# Patient Record
Sex: Male | Born: 1960 | Race: White | Hispanic: No | Smoking: Former smoker
Health system: Southern US, Community
[De-identification: ages and names within clinical notes are randomized; demographics above are authoritative.]

## PROBLEM LIST (undated history)

## (undated) DIAGNOSIS — M549 Dorsalgia, unspecified: Secondary | ICD-10-CM

## (undated) DIAGNOSIS — F32A Depression, unspecified: Secondary | ICD-10-CM

## (undated) DIAGNOSIS — F419 Anxiety disorder, unspecified: Secondary | ICD-10-CM

## (undated) DIAGNOSIS — R519 Headache, unspecified: Secondary | ICD-10-CM

## (undated) DIAGNOSIS — G2581 Restless legs syndrome: Secondary | ICD-10-CM

## (undated) DIAGNOSIS — M1990 Unspecified osteoarthritis, unspecified site: Secondary | ICD-10-CM

## (undated) DIAGNOSIS — G629 Polyneuropathy, unspecified: Secondary | ICD-10-CM

## (undated) DIAGNOSIS — G8929 Other chronic pain: Secondary | ICD-10-CM

## (undated) DIAGNOSIS — R51 Headache: Secondary | ICD-10-CM

## (undated) DIAGNOSIS — F329 Major depressive disorder, single episode, unspecified: Secondary | ICD-10-CM

## (undated) DIAGNOSIS — R0600 Dyspnea, unspecified: Secondary | ICD-10-CM

## (undated) DIAGNOSIS — H9190 Unspecified hearing loss, unspecified ear: Secondary | ICD-10-CM

## (undated) HISTORY — PX: TONSILLECTOMY: SUR1361

## (undated) HISTORY — PX: INNER EAR SURGERY: SHX679

## (undated) HISTORY — PX: MOUTH SURGERY: SHX715

## (undated) HISTORY — PX: HERNIA REPAIR: SHX51

---

## 2016-11-27 DIAGNOSIS — R292 Abnormal reflex: Secondary | ICD-10-CM | POA: Insufficient documentation

## 2016-11-27 DIAGNOSIS — I639 Cerebral infarction, unspecified: Secondary | ICD-10-CM | POA: Insufficient documentation

## 2016-12-25 DIAGNOSIS — G8929 Other chronic pain: Secondary | ICD-10-CM | POA: Insufficient documentation

## 2016-12-25 DIAGNOSIS — M545 Low back pain, unspecified: Secondary | ICD-10-CM | POA: Insufficient documentation

## 2017-02-05 ENCOUNTER — Other Ambulatory Visit: Payer: Self-pay | Admitting: Neurological Surgery

## 2017-02-26 ENCOUNTER — Encounter (HOSPITAL_COMMUNITY): Payer: Self-pay

## 2017-02-26 ENCOUNTER — Encounter (HOSPITAL_COMMUNITY)
Admission: RE | Admit: 2017-02-26 | Discharge: 2017-02-26 | Disposition: A | Discharge status: Home / Self Care | Payer: Medicaid Other | Source: Ambulatory Visit | Attending: Neurological Surgery | Admitting: Neurological Surgery

## 2017-02-26 DIAGNOSIS — Z01812 Encounter for preprocedural laboratory examination: Secondary | ICD-10-CM | POA: Diagnosis present

## 2017-02-26 HISTORY — DX: Major depressive disorder, single episode, unspecified: F32.9

## 2017-02-26 HISTORY — DX: Unspecified hearing loss, unspecified ear: H91.90

## 2017-02-26 HISTORY — DX: Restless legs syndrome: G25.81

## 2017-02-26 HISTORY — DX: Headache: R51

## 2017-02-26 HISTORY — DX: Polyneuropathy, unspecified: G62.9

## 2017-02-26 HISTORY — DX: Other chronic pain: G89.29

## 2017-02-26 HISTORY — DX: Headache, unspecified: R51.9

## 2017-02-26 HISTORY — DX: Dorsalgia, unspecified: M54.9

## 2017-02-26 HISTORY — DX: Anxiety disorder, unspecified: F41.9

## 2017-02-26 HISTORY — DX: Unspecified osteoarthritis, unspecified site: M19.90

## 2017-02-26 HISTORY — DX: Depression, unspecified: F32.A

## 2017-02-26 HISTORY — DX: Dyspnea, unspecified: R06.00

## 2017-02-26 LAB — BASIC METABOLIC PANEL
ANION GAP: 6 (ref 5–15)
BUN: 16 mg/dL (ref 6–20)
CALCIUM: 9.1 mg/dL (ref 8.9–10.3)
CO2: 27 mmol/L (ref 22–32)
CREATININE: 0.8 mg/dL (ref 0.61–1.24)
Chloride: 105 mmol/L (ref 101–111)
GFR calc Af Amer: >60 mL/min (ref >60)
GFR calc non Af Amer: >60 mL/min (ref >60)
GLUCOSE: 74 mg/dL (ref 65–99)
Potassium: 4.1 mmol/L (ref 3.5–5.1)
Sodium: 138 mmol/L (ref 135–145)

## 2017-02-26 LAB — TYPE AND SCREEN
ABO/RH(D): O POS
ANTIBODY SCREEN: NEGATIVE

## 2017-02-26 LAB — CBC
HCT: 42.9 % (ref 39.0–52.0)
Hemoglobin: 14.8 g/dL (ref 13.0–17.0)
MCH: 33.3 pg (ref 26.0–34.0)
MCHC: 34.5 g/dL (ref 30.0–36.0)
MCV: 96.6 fL (ref 78.0–100.0)
PLATELETS: 287 10*3/uL (ref 150–400)
RBC: 4.44 MIL/uL (ref 4.22–5.81)
RDW: 13 % (ref 11.5–15.5)
WBC: 7.7 10*3/uL (ref 4.0–10.5)

## 2017-02-26 LAB — SURGICAL PCR SCREEN
MRSA, PCR: NEGATIVE
STAPHYLOCOCCUS AUREUS: NEGATIVE

## 2017-02-26 LAB — ABO/RH: ABO/RH(D): O POS

## 2017-02-26 MED ORDER — CHLORHEXIDINE GLUCONATE CLOTH 2 % EX PADS: 6.0000 | MEDICATED_PAD | Freq: Once | CUTANEOUS | Status: DC

## 2017-02-26 NOTE — Progress Notes (Signed)
Cardiologist denies  Medical Md is Dr.Pamela Penner  Stress test done in 2009  Echo done in 2009  Heart cath denies  EKG denies in past yr   CXR denies in past yr

## 2017-02-26 NOTE — Pre-Procedure Instructions (Signed)
Verne CarrowWilliam Helinski  02/26/2017      [id number] - [place], [place] - [address] [facility] [address] [place] [address] Phone: [phone] Fax: [phone]    Your procedure is scheduled on Fri, May 25 @ 7:30 AM  [facility] at 5:30 AM  Call this number if you have problems the morning of surgery:  [phone]   Remember:  Do not eat food or drink liquids after midnight.               Stop taking your Diclofenac along with any Vitamins or Herbal Medications. No Goody's,BC's,Aleve,Advil,Motrin,Ibuprofen,Fish Oil,or Aspirin.    Do not wear jewelry.  Do not wear lotions, powders,colognes, or deoderant.  Do not shave 48 hours prior to surgery.  Men may shave face and neck.  Do not bring valuables to the hospital.  [facility] belongings or valuables.  Contacts, dentures or bridgework may not be worn into surgery.  Leave your suitcase in the car.  After surgery it may be brought to your room.  For patients admitted to the hospital, discharge time will be determined by your treatment team.  Patients discharged the day of surgery will not be allowed to drive home.   [facility]  Before surgery, you can play an important role.  Because skin is not sterile, your skin needs to be as free of germs as possible.  You can reduce the number of germs on you skin by washing with CHG (chlorahexidine gluconate) soap before surgery.  CHG is an antiseptic cleaner which kills germs and bonds with the skin to continue killing germs even after washing.  Please DO NOT use if you have an allergy to CHG or antibacterial soaps.  If your skin becomes reddened/irritated stop using the CHG and inform your nurse when you arrive at Short Stay.  Do not shave (including legs and underarms) for at least 48 hours prior to the first CHG shower.  You may shave your face.  Please  follow these instructions carefully:   1.  Shower with CHG Soap the night before surgery and the                                morning of Surgery.  2.  If you choose to wash your hair, wash your hair first as usual with your       normal shampoo.  3.  After you shampoo, rinse your hair and body thoroughly to remove the                      Shampoo.  4.  Use CHG as you would any other liquid soap.  You can apply chg directly       to the skin and wash gently with scrungie or a clean washcloth.  5.  Apply the CHG Soap to your body ONLY FROM THE NECK DOWN.        Do not use on open wounds or open sores.  Avoid contact with your eyes,       ears, mouth and genitals (private parts).  Wash genitals (private parts)       with your normal soap.  6.  Wash thoroughly, paying special attention to the area where your surgery  will be performed.  7.  Thoroughly rinse your body with warm water from the neck down.  8.  DO NOT shower/wash with your normal soap after using and rinsing off       the CHG Soap.  9.  Pat yourself dry with a clean towel.            10.  Wear clean pajamas.            11.  Place clean sheets on your bed the night of your first shower and do not        sleep with pets.  Day of Surgery  Do not apply any lotions/deoderants the morning of surgery.  Please wear clean clothes to the hospital/surgery center.     Please read over the following fact sheets that you were given. Pain Booklet, Coughing and Deep Breathing, MRSA Information and Surgical Site Infection Prevention

## 2017-02-27 ENCOUNTER — Other Ambulatory Visit: Payer: Self-pay | Admitting: Neurological Surgery

## 2017-03-06 ENCOUNTER — Inpatient Hospital Stay (HOSPITAL_COMMUNITY): Admission: RE | Admit: 2017-03-06 | Payer: Medicaid Other | Source: Ambulatory Visit | Admitting: Neurological Surgery

## 2017-03-06 ENCOUNTER — Encounter (HOSPITAL_COMMUNITY): Admission: RE | Payer: Self-pay | Source: Ambulatory Visit

## 2017-03-06 SURGERY — ANTERIOR CERVICAL DECOMPRESSION/DISCECTOMY FUSION 3 LEVELS
Anesthesia: General

## 2017-03-11 ENCOUNTER — Encounter (HOSPITAL_COMMUNITY)
Admission: RE | Disposition: A | Discharge status: Home / Self Care | Payer: Self-pay | Source: Ambulatory Visit | Attending: Neurological Surgery

## 2017-03-11 ENCOUNTER — Encounter (HOSPITAL_COMMUNITY): Payer: Self-pay | Admitting: *Deleted

## 2017-03-11 ENCOUNTER — Inpatient Hospital Stay (HOSPITAL_COMMUNITY)
Admission: RE | Admit: 2017-03-11 | Discharge: 2017-03-13 | DRG: 455 | Disposition: A | Discharge status: Home / Self Care | Payer: Medicaid Other | Source: Ambulatory Visit | Attending: Neurological Surgery | Admitting: Neurological Surgery

## 2017-03-11 ENCOUNTER — Inpatient Hospital Stay (HOSPITAL_COMMUNITY): Payer: Medicaid Other | Admitting: Anesthesiology

## 2017-03-11 ENCOUNTER — Inpatient Hospital Stay (HOSPITAL_COMMUNITY): Payer: Medicaid Other

## 2017-03-11 DIAGNOSIS — H9190 Unspecified hearing loss, unspecified ear: Secondary | ICD-10-CM | POA: Diagnosis present

## 2017-03-11 DIAGNOSIS — Z87891 Personal history of nicotine dependence: Secondary | ICD-10-CM | POA: Diagnosis not present

## 2017-03-11 DIAGNOSIS — Z419 Encounter for procedure for purposes other than remedying health state, unspecified: Secondary | ICD-10-CM

## 2017-03-11 DIAGNOSIS — M4722 Other spondylosis with radiculopathy, cervical region: Principal | ICD-10-CM | POA: Diagnosis present

## 2017-03-11 DIAGNOSIS — G2581 Restless legs syndrome: Secondary | ICD-10-CM | POA: Diagnosis present

## 2017-03-11 DIAGNOSIS — M4803 Spinal stenosis, cervicothoracic region: Secondary | ICD-10-CM | POA: Diagnosis present

## 2017-03-11 DIAGNOSIS — M4802 Spinal stenosis, cervical region: Secondary | ICD-10-CM | POA: Diagnosis present

## 2017-03-11 HISTORY — PX: ANTERIOR CERVICAL DECOMP/DISCECTOMY FUSION: SHX1161

## 2017-03-11 SURGERY — ANTERIOR CERVICAL DECOMPRESSION/DISCECTOMY FUSION 3 LEVELS
Anesthesia: General

## 2017-03-11 MED ORDER — ONDANSETRON HCL 4 MG/2ML IJ SOLN
4.0000 mg | Freq: Four times a day (QID) | INTRAMUSCULAR | Status: DC | PRN
  Administered 2017-03-12: 4 mg via INTRAVENOUS

## 2017-03-11 MED ORDER — ZOLPIDEM TARTRATE 5 MG PO TABS: 5.0000 mg | ORAL_TABLET | Freq: Every evening | ORAL | Status: DC | PRN

## 2017-03-11 MED ORDER — ACETAMINOPHEN 500 MG PO TABS
1000.0000 mg | ORAL_TABLET | Freq: Four times a day (QID) | ORAL | Status: DC
  Administered 2017-03-11 – 2017-03-13 (×5): 1000 mg via ORAL
  Filled 2017-03-11 (×5): qty 2

## 2017-03-11 MED ORDER — LIDOCAINE-EPINEPHRINE (PF) 2 %-1:200000 IJ SOLN
INTRAMUSCULAR | Status: AC
  Filled 2017-03-11: qty 20

## 2017-03-11 MED ORDER — THROMBIN 20000 UNITS EX SOLR
CUTANEOUS | Status: DC | PRN
  Administered 2017-03-11: 20000 [IU] via TOPICAL

## 2017-03-11 MED ORDER — DEXAMETHASONE SODIUM PHOSPHATE 4 MG/ML IJ SOLN
2.0000 mg | Freq: Three times a day (TID) | INTRAMUSCULAR | Status: AC
  Administered 2017-03-11 – 2017-03-12 (×3): 2 mg via INTRAVENOUS
  Filled 2017-03-11 (×3): qty 1

## 2017-03-11 MED ORDER — SERTRALINE HCL 50 MG PO TABS
50.0000 mg | ORAL_TABLET | Freq: Every day | ORAL | Status: DC
  Administered 2017-03-11 – 2017-03-12 (×2): 50 mg via ORAL
  Filled 2017-03-11 (×2): qty 1

## 2017-03-11 MED ORDER — ACETAMINOPHEN 10 MG/ML IV SOLN
INTRAVENOUS | Status: DC | PRN
  Administered 2017-03-11: 1000 mg via INTRAVENOUS

## 2017-03-11 MED ORDER — THROMBIN 5000 UNITS EX SOLR
CUTANEOUS | Status: AC
  Filled 2017-03-11: qty 5000

## 2017-03-11 MED ORDER — SODIUM CHLORIDE 0.9% FLUSH
3.0000 mL | Freq: Two times a day (BID) | INTRAVENOUS | Status: DC
Start: 2017-03-11 — End: 2017-03-13
  Administered 2017-03-12: 3 mL via INTRAVENOUS

## 2017-03-11 MED ORDER — SODIUM CHLORIDE 0.9% FLUSH: 3.0000 mL | INTRAVENOUS | Status: DC | PRN

## 2017-03-11 MED ORDER — LIDOCAINE-EPINEPHRINE 2 %-1:100000 IJ SOLN
INTRAMUSCULAR | Status: DC | PRN
  Administered 2017-03-11: 11 mL

## 2017-03-11 MED ORDER — PANTOPRAZOLE SODIUM 40 MG IV SOLR
40.0000 mg | Freq: Every day | INTRAVENOUS | Status: DC
  Administered 2017-03-11 – 2017-03-12 (×2): 40 mg via INTRAVENOUS
  Filled 2017-03-11 (×2): qty 40

## 2017-03-11 MED ORDER — SUGAMMADEX SODIUM 200 MG/2ML IV SOLN
INTRAVENOUS | Status: DC | PRN
  Administered 2017-03-11: 165 mg via INTRAVENOUS

## 2017-03-11 MED ORDER — FENTANYL CITRATE (PF) 100 MCG/2ML IJ SOLN
INTRAMUSCULAR | Status: DC | PRN
  Administered 2017-03-11: 50 ug via INTRAVENOUS
  Administered 2017-03-11: 100 ug via INTRAVENOUS
  Administered 2017-03-11 (×3): 50 ug via INTRAVENOUS
  Administered 2017-03-11 (×2): 25 ug via INTRAVENOUS
  Administered 2017-03-11: 50 ug via INTRAVENOUS

## 2017-03-11 MED ORDER — SUGAMMADEX SODIUM 200 MG/2ML IV SOLN: INTRAVENOUS | Status: DC | PRN

## 2017-03-11 MED ORDER — PROMETHAZINE HCL 25 MG/ML IJ SOLN: 6.2500 mg | INTRAMUSCULAR | Status: DC | PRN

## 2017-03-11 MED ORDER — HYDROMORPHONE HCL 1 MG/ML IJ SOLN
0.2500 mg | INTRAMUSCULAR | Status: DC | PRN
  Administered 2017-03-11: 0.5 mg via INTRAVENOUS

## 2017-03-11 MED ORDER — OXYCODONE HCL 5 MG PO TABS: 5.0000 mg | ORAL_TABLET | Freq: Once | ORAL | Status: DC | PRN

## 2017-03-11 MED ORDER — FENTANYL CITRATE (PF) 250 MCG/5ML IJ SOLN
INTRAMUSCULAR | Status: AC
  Filled 2017-03-11: qty 5

## 2017-03-11 MED ORDER — DEXAMETHASONE SODIUM PHOSPHATE 10 MG/ML IJ SOLN
INTRAMUSCULAR | Status: AC
  Filled 2017-03-11: qty 1

## 2017-03-11 MED ORDER — CEFAZOLIN SODIUM-DEXTROSE 2-4 GM/100ML-% IV SOLN
INTRAVENOUS | Status: AC
  Filled 2017-03-11: qty 100

## 2017-03-11 MED ORDER — HYDROMORPHONE HCL 1 MG/ML IJ SOLN: 0.2500 mg | INTRAMUSCULAR | Status: DC | PRN

## 2017-03-11 MED ORDER — ROPINIROLE HCL 0.25 MG PO TABS: 0.2500 mg | ORAL_TABLET | Freq: Every day | ORAL | Status: DC

## 2017-03-11 MED ORDER — PHENYLEPHRINE HCL 10 MG/ML IJ SOLN
INTRAVENOUS | Status: DC | PRN
  Administered 2017-03-11: 20 ug/min via INTRAVENOUS
  Administered 2017-03-11: 60 ug/min via INTRAVENOUS

## 2017-03-11 MED ORDER — DEXAMETHASONE SODIUM PHOSPHATE 10 MG/ML IJ SOLN
INTRAMUSCULAR | Status: DC | PRN
  Administered 2017-03-11: 10 mg via INTRAVENOUS

## 2017-03-11 MED ORDER — SENNA 8.6 MG PO TABS
1.0000 | ORAL_TABLET | Freq: Two times a day (BID) | ORAL | Status: DC
Start: 2017-03-11 — End: 2017-03-13
  Administered 2017-03-11 – 2017-03-13 (×3): 8.6 mg via ORAL
  Filled 2017-03-11 (×4): qty 1

## 2017-03-11 MED ORDER — ROTIGOTINE 4 MG/24HR TD PT24: 1.0000 | MEDICATED_PATCH | Freq: Every day | TRANSDERMAL | Status: DC

## 2017-03-11 MED ORDER — MIDAZOLAM HCL 5 MG/5ML IJ SOLN
INTRAMUSCULAR | Status: DC | PRN
  Administered 2017-03-11: 2 mg via INTRAVENOUS

## 2017-03-11 MED ORDER — SUGAMMADEX SODIUM 200 MG/2ML IV SOLN
INTRAVENOUS | Status: AC
  Filled 2017-03-11: qty 2

## 2017-03-11 MED ORDER — CHLORHEXIDINE GLUCONATE CLOTH 2 % EX PADS: 6.0000 | MEDICATED_PAD | Freq: Once | CUTANEOUS | Status: DC

## 2017-03-11 MED ORDER — ACETAMINOPHEN 10 MG/ML IV SOLN
INTRAVENOUS | Status: AC
  Filled 2017-03-11: qty 100

## 2017-03-11 MED ORDER — METHOCARBAMOL 750 MG PO TABS
750.0000 mg | ORAL_TABLET | Freq: Four times a day (QID) | ORAL | Status: DC
  Administered 2017-03-11 – 2017-03-13 (×4): 750 mg via ORAL
  Filled 2017-03-11 (×4): qty 1

## 2017-03-11 MED ORDER — PHENYLEPHRINE 40 MCG/ML (10ML) SYRINGE FOR IV PUSH (FOR BLOOD PRESSURE SUPPORT)
PREFILLED_SYRINGE | INTRAVENOUS | Status: AC
  Filled 2017-03-11: qty 10

## 2017-03-11 MED ORDER — PROPOFOL 10 MG/ML IV BOLUS
INTRAVENOUS | Status: AC
  Filled 2017-03-11: qty 20

## 2017-03-11 MED ORDER — OXYCODONE HCL 5 MG/5ML PO SOLN: 5.0000 mg | Freq: Once | ORAL | Status: DC | PRN

## 2017-03-11 MED ORDER — GABAPENTIN 300 MG PO CAPS
300.0000 mg | ORAL_CAPSULE | Freq: Three times a day (TID) | ORAL | Status: DC
  Administered 2017-03-11 – 2017-03-13 (×4): 300 mg via ORAL
  Filled 2017-03-11 (×4): qty 1

## 2017-03-11 MED ORDER — PHENYLEPHRINE 40 MCG/ML (10ML) SYRINGE FOR IV PUSH (FOR BLOOD PRESSURE SUPPORT)
PREFILLED_SYRINGE | INTRAVENOUS | Status: DC | PRN
  Administered 2017-03-11: 80 ug via INTRAVENOUS
  Administered 2017-03-11 (×4): 40 ug via INTRAVENOUS

## 2017-03-11 MED ORDER — BUPIVACAINE-EPINEPHRINE (PF) 0.5% -1:200000 IJ SOLN
INTRAMUSCULAR | Status: AC
  Filled 2017-03-11: qty 30

## 2017-03-11 MED ORDER — PROPOFOL 10 MG/ML IV BOLUS
INTRAVENOUS | Status: DC | PRN
  Administered 2017-03-11: 150 mg via INTRAVENOUS

## 2017-03-11 MED ORDER — ROCURONIUM BROMIDE 10 MG/ML (PF) SYRINGE
PREFILLED_SYRINGE | INTRAVENOUS | Status: DC | PRN
  Administered 2017-03-11: 10 mg via INTRAVENOUS
  Administered 2017-03-11: 50 mg via INTRAVENOUS
  Administered 2017-03-11 (×2): 10 mg via INTRAVENOUS

## 2017-03-11 MED ORDER — BUPIVACAINE-EPINEPHRINE (PF) 0.5% -1:200000 IJ SOLN
INTRAMUSCULAR | Status: DC | PRN
  Administered 2017-03-11: 11 mL

## 2017-03-11 MED ORDER — ONDANSETRON HCL 4 MG/2ML IJ SOLN
INTRAMUSCULAR | Status: AC
  Filled 2017-03-11: qty 2

## 2017-03-11 MED ORDER — THROMBIN 20000 UNITS EX SOLR
CUTANEOUS | Status: AC
  Filled 2017-03-11: qty 20000

## 2017-03-11 MED ORDER — PHENOL 1.4 % MT LIQD: 1.0000 | OROMUCOSAL | Status: DC | PRN

## 2017-03-11 MED ORDER — ONDANSETRON HCL 4 MG PO TABS
4.0000 mg | ORAL_TABLET | Freq: Four times a day (QID) | ORAL | Status: DC | PRN
Start: 2017-03-11 — End: 2017-03-13

## 2017-03-11 MED ORDER — SODIUM CHLORIDE 0.9 % IV SOLN
250.0000 mL | INTRAVENOUS | Status: DC
Start: 2017-03-11 — End: 2017-03-13

## 2017-03-11 MED ORDER — MIDAZOLAM HCL 2 MG/2ML IJ SOLN
INTRAMUSCULAR | Status: AC
  Filled 2017-03-11: qty 2

## 2017-03-11 MED ORDER — CELECOXIB 200 MG PO CAPS
200.0000 mg | ORAL_CAPSULE | Freq: Two times a day (BID) | ORAL | Status: DC
Start: 2017-03-11 — End: 2017-03-13
  Administered 2017-03-11 – 2017-03-13 (×3): 200 mg via ORAL
  Filled 2017-03-11 (×3): qty 1

## 2017-03-11 MED ORDER — ONDANSETRON HCL 4 MG/2ML IJ SOLN
INTRAMUSCULAR | Status: DC | PRN
  Administered 2017-03-11: 4 mg via INTRAVENOUS

## 2017-03-11 MED ORDER — SODIUM CHLORIDE 0.9 % IV SOLN
INTRAVENOUS | Status: DC
  Administered 2017-03-11: 22:00:00 via INTRAVENOUS

## 2017-03-11 MED ORDER — DOCUSATE SODIUM 100 MG PO CAPS
100.0000 mg | ORAL_CAPSULE | Freq: Two times a day (BID) | ORAL | Status: DC
  Administered 2017-03-11 – 2017-03-13 (×3): 100 mg via ORAL
  Filled 2017-03-11 (×3): qty 1

## 2017-03-11 MED ORDER — THROMBIN 5000 UNITS EX SOLR
CUTANEOUS | Status: DC | PRN
  Administered 2017-03-11: 5000 [IU] via TOPICAL

## 2017-03-11 MED ORDER — HYDROMORPHONE HCL 1 MG/ML IJ SOLN
INTRAMUSCULAR | Status: AC
  Filled 2017-03-11: qty 0.5

## 2017-03-11 MED ORDER — BISACODYL 10 MG RE SUPP: 10.0000 mg | Freq: Every day | RECTAL | Status: DC | PRN

## 2017-03-11 MED ORDER — MENTHOL 3 MG MT LOZG: 1.0000 | LOZENGE | OROMUCOSAL | Status: DC | PRN

## 2017-03-11 MED ORDER — SODIUM CHLORIDE 0.9 % IR SOLN
Status: DC | PRN
  Administered 2017-03-11: 12:00:00

## 2017-03-11 MED ORDER — DIAZEPAM 5 MG PO TABS
5.0000 mg | ORAL_TABLET | Freq: Four times a day (QID) | ORAL | Status: DC | PRN
  Filled 2017-03-11: qty 1

## 2017-03-11 MED ORDER — 0.9 % SODIUM CHLORIDE (POUR BTL) OPTIME
TOPICAL | Status: DC | PRN
  Administered 2017-03-11: 1000 mL

## 2017-03-11 MED ORDER — THROMBIN 5000 UNITS EX SOLR
OROMUCOSAL | Status: DC | PRN
  Administered 2017-03-11 (×2): via TOPICAL

## 2017-03-11 MED ORDER — FLEET ENEMA 7-19 GM/118ML RE ENEM: 1.0000 | ENEMA | Freq: Once | RECTAL | Status: DC | PRN

## 2017-03-11 MED ORDER — LACTATED RINGERS IV SOLN
INTRAVENOUS | Status: DC
  Administered 2017-03-11 (×2): via INTRAVENOUS

## 2017-03-11 MED ORDER — CEFAZOLIN SODIUM-DEXTROSE 2-4 GM/100ML-% IV SOLN
2.0000 g | INTRAVENOUS | Status: AC
  Administered 2017-03-11: 2 g via INTRAVENOUS

## 2017-03-11 MED ORDER — CEFAZOLIN SODIUM-DEXTROSE 1-4 GM/50ML-% IV SOLN
1.0000 g | Freq: Three times a day (TID) | INTRAVENOUS | Status: DC
  Administered 2017-03-11 – 2017-03-13 (×5): 1 g via INTRAVENOUS
  Filled 2017-03-11 (×5): qty 50

## 2017-03-11 MED ORDER — OXYCODONE HCL 5 MG PO TABS
5.0000 mg | ORAL_TABLET | ORAL | Status: DC | PRN
  Administered 2017-03-11: 5 mg via ORAL
  Administered 2017-03-12 (×2): 10 mg via ORAL
  Filled 2017-03-11: qty 2
  Filled 2017-03-11: qty 1

## 2017-03-11 MED ORDER — OXYCODONE HCL ER 10 MG PO T12A
10.0000 mg | EXTENDED_RELEASE_TABLET | Freq: Two times a day (BID) | ORAL | Status: DC
  Administered 2017-03-11 – 2017-03-13 (×3): 10 mg via ORAL
  Filled 2017-03-11 (×3): qty 1

## 2017-03-11 MED ORDER — LIDOCAINE HCL (CARDIAC) 20 MG/ML IV SOLN
INTRAVENOUS | Status: DC | PRN
  Administered 2017-03-11: 60 mg via INTRAVENOUS

## 2017-03-11 MED ORDER — GLYCOPYRROLATE 0.2 MG/ML IV SOSY
PREFILLED_SYRINGE | INTRAVENOUS | Status: DC | PRN
  Administered 2017-03-11: .2 mg via INTRAVENOUS

## 2017-03-11 SURGICAL SUPPLY — 69 items
BIT DRILL INVIZIA (BIT) ×1 IMPLANT
BLADE ULTRA TIP 2M (BLADE) IMPLANT
BONE EQUIVA 5CC (Bone Implant) ×2 IMPLANT
BUR MATCHSTICK NEURO 3.0 LAGG (BURR) ×2 IMPLANT
CANISTER SUCT 3000ML PPV (MISCELLANEOUS) ×2 IMPLANT
CARTRIDGE OIL MAESTRO DRILL (MISCELLANEOUS) ×1 IMPLANT
CHLORAPREP W/TINT 26ML (MISCELLANEOUS) ×2 IMPLANT
DECANTER SPIKE VIAL GLASS SM (MISCELLANEOUS) ×2 IMPLANT
DERMABOND ADVANCED (GAUZE/BANDAGES/DRESSINGS) ×1
DERMABOND ADVANCED .7 DNX12 (GAUZE/BANDAGES/DRESSINGS) ×1 IMPLANT
DIFFUSER DRILL AIR PNEUMATIC (MISCELLANEOUS) ×2 IMPLANT
DRAPE C-ARM 42X72 X-RAY (DRAPES) ×4 IMPLANT
DRAPE HALF SHEET 40X57 (DRAPES) IMPLANT
DRAPE LAPAROTOMY 100X72 PEDS (DRAPES) ×2 IMPLANT
DRAPE MICROSCOPE LEICA (MISCELLANEOUS) ×2 IMPLANT
DRAPE POUCH INSTRU U-SHP 10X18 (DRAPES) ×2 IMPLANT
DRAPE SHEET LG 3/4 BI-LAMINATE (DRAPES) ×2 IMPLANT
DRILL BIT INVIZIA (BIT) ×2
DRSG OPSITE POSTOP 3X4 (GAUZE/BANDAGES/DRESSINGS) ×2 IMPLANT
ELECT COATED BLADE 2.86 ST (ELECTRODE) ×2 IMPLANT
ELECT REM PT RETURN 9FT ADLT (ELECTROSURGICAL) ×2
ELECTRODE REM PT RTRN 9FT ADLT (ELECTROSURGICAL) ×1 IMPLANT
EVACUATOR 1/8 PVC DRAIN (DRAIN) ×2 IMPLANT
GAUZE SPONGE 4X4 12PLY STRL (GAUZE/BANDAGES/DRESSINGS) IMPLANT
GAUZE SPONGE 4X4 16PLY XRAY LF (GAUZE/BANDAGES/DRESSINGS) IMPLANT
GLOVE BIO SURGEON STRL SZ7 (GLOVE) IMPLANT
GLOVE BIOGEL PI IND STRL 7.0 (GLOVE) ×2 IMPLANT
GLOVE BIOGEL PI IND STRL 7.5 (GLOVE) ×1 IMPLANT
GLOVE BIOGEL PI INDICATOR 7.0 (GLOVE) ×2
GLOVE BIOGEL PI INDICATOR 7.5 (GLOVE) ×1
GLOVE EXAM NITRILE LRG STRL (GLOVE) IMPLANT
GLOVE SS BIOGEL STRL SZ 7.5 (GLOVE) ×3 IMPLANT
GLOVE SUPERSENSE BIOGEL SZ 7.5 (GLOVE) ×3
GOWN STRL REUS W/ TWL LRG LVL3 (GOWN DISPOSABLE) ×1 IMPLANT
GOWN STRL REUS W/ TWL XL LVL3 (GOWN DISPOSABLE) ×1 IMPLANT
GOWN STRL REUS W/TWL LRG LVL3 (GOWN DISPOSABLE) ×1
GOWN STRL REUS W/TWL XL LVL3 (GOWN DISPOSABLE) ×1
HEMOSTAT POWDER KIT SURGIFOAM (HEMOSTASIS) ×4 IMPLANT
INTERBODY TM 14X14X5-7DEG ANG (Metal Cage) ×6 IMPLANT
KIT BASIN OR (CUSTOM PROCEDURE TRAY) ×2 IMPLANT
KIT ROOM TURNOVER OR (KITS) ×2 IMPLANT
NEEDLE HYPO 21X1.5 SAFETY (NEEDLE) ×2 IMPLANT
NEEDLE SPNL 18GX3.5 QUINCKE PK (NEEDLE) ×2 IMPLANT
NS IRRIG 1000ML POUR BTL (IV SOLUTION) ×2 IMPLANT
OIL CARTRIDGE MAESTRO DRILL (MISCELLANEOUS) ×2
PACK LAMINECTOMY NEURO (CUSTOM PROCEDURE TRAY) ×2 IMPLANT
PAD ARMBOARD 7.5X6 YLW CONV (MISCELLANEOUS) ×2 IMPLANT
PATTIES SURGICAL .5X1.5 (GAUZE/BANDAGES/DRESSINGS) ×2 IMPLANT
PIN DISTRACTION 14MM (PIN) ×4 IMPLANT
PIN THREADED TEMP FIX INVIZIA (PIN) ×2 IMPLANT
PLATE INVIZIA 3 LEV 60MM (Plate) ×2 IMPLANT
RUBBERBAND STERILE (MISCELLANEOUS) IMPLANT
SCREW 16MM (Screw) ×12 IMPLANT
SCREW SPINAL 42.X16MM TITAN (Screw) ×4 IMPLANT
SPONGE INTESTINAL PEANUT (DISPOSABLE) ×2 IMPLANT
SPONGE SURGIFOAM ABS GEL SZ50 (HEMOSTASIS) ×2 IMPLANT
STAPLER VISISTAT 35W (STAPLE) ×2 IMPLANT
STOCKINETTE 6  STRL (DRAPES) ×1
STOCKINETTE 6 STRL (DRAPES) ×1 IMPLANT
SUT MNCRL AB 3-0 PS2 18 (SUTURE) ×2 IMPLANT
SUT STRATAFIX MNCRL+ 3-0 PS-2 (SUTURE) ×2
SUT STRATAFIX MONOCRYL 3-0 (SUTURE) ×2
SUT VIC AB 3-0 SH 8-18 (SUTURE) ×2 IMPLANT
SUTURE STRATFX MNCRL+ 3-0 PS-2 (SUTURE) ×2 IMPLANT
TOWEL GREEN STERILE (TOWEL DISPOSABLE) ×2 IMPLANT
TOWEL GREEN STERILE FF (TOWEL DISPOSABLE) ×4 IMPLANT
TRAY FOLEY CATH SILVER 16FR (SET/KITS/TRAYS/PACK) ×2 IMPLANT
TUBE CONNECTING 12X1/4 (SUCTIONS) ×2 IMPLANT
WATER STERILE IRR 1000ML POUR (IV SOLUTION) ×2 IMPLANT

## 2017-03-11 NOTE — Anesthesia Preprocedure Evaluation (Addendum)
Anesthesia Evaluation  Patient identified by MRN, date of birth, ID band Patient awake    Reviewed: Allergy & Precautions, NPO status , Patient's Chart, lab work & pertinent test results  Airway Mallampati: II  TM Distance: >3 FB Neck ROM: Limited    Dental no notable dental hx. (+) Edentulous Upper, Missing   Pulmonary former smoker,    Pulmonary exam normal breath sounds clear to auscultation       Cardiovascular negative cardio ROS Normal cardiovascular exam Rhythm:Regular Rate:Normal  Over 4 MET's   Neuro/Psych Anxiety Depression deafness negative neurological ROS     GI/Hepatic negative GI ROS, Neg liver ROS,   Endo/Other  negative endocrine ROS  Renal/GU negative Renal ROS  negative genitourinary   Musculoskeletal negative musculoskeletal ROS (+)   Abdominal   Peds negative pediatric ROS (+)  Hematology negative hematology ROS (+)   Anesthesia Other Findings Restless leg syn  Reproductive/Obstetrics negative OB ROS                           Anesthesia Physical Anesthesia Plan  ASA: III  Anesthesia Plan: General   Post-op Pain Management:    Induction: Intravenous  Airway Management Planned: Oral ETT  Additional Equipment:   Intra-op Plan:   Post-operative Plan: Extubation in OR  Informed Consent: I have reviewed the patients History and Physical, chart, labs and discussed the procedure including the risks, benefits and alternatives for the proposed anesthesia with the patient or authorized representative who has indicated his/her understanding and acceptance.   Dental advisory given  Plan Discussed with: CRNA and Surgeon  Anesthesia Plan Comments:        Anesthesia Quick Evaluation

## 2017-03-11 NOTE — Anesthesia Procedure Notes (Signed)
Procedure Name: Intubation Date/Time: 03/11/2017 12:29 PM Performed by: Merrilyn Puma B Pre-anesthesia Checklist: Patient identified, Emergency Drugs available, Suction available, Patient being monitored and Timeout performed Patient Re-evaluated:Patient Re-evaluated prior to inductionOxygen Delivery Method: Circle system utilized Preoxygenation: Pre-oxygenation with 100% oxygen Intubation Type: IV induction Ventilation: Mask ventilation without difficulty Laryngoscope Size: Mac and 3 Grade View: Grade I Tube type: Oral Tube size: 7.5 mm Number of attempts: 1 Airway Equipment and Method: Stylet Placement Confirmation: ETT inserted through vocal cords under direct vision,  positive ETCO2,  CO2 detector and breath sounds checked- equal and bilateral Secured at: 22 cm Tube secured with: Tape Dental Injury: Teeth and Oropharynx as per pre-operative assessment

## 2017-03-11 NOTE — Anesthesia Preprocedure Evaluation (Addendum)
Anesthesia Evaluation  Patient identified by MRN, date of birth, ID band Patient awake    Reviewed: Allergy & Precautions, NPO status , Patient's Chart, lab work & pertinent test results  Airway Mallampati: II  TM Distance: >3 FB Neck ROM: Limited    Dental no notable dental hx. (+) Edentulous Upper, Missing, Dental Advisory Given   Pulmonary former smoker,    Pulmonary exam normal breath sounds clear to auscultation       Cardiovascular negative cardio ROS Normal cardiovascular exam Rhythm:Regular Rate:Normal  Over 4 MET's   Neuro/Psych Anxiety Depression deafness negative neurological ROS     GI/Hepatic negative GI ROS, Neg liver ROS,   Endo/Other  negative endocrine ROS  Renal/GU negative Renal ROS  negative genitourinary   Musculoskeletal negative musculoskeletal ROS (+)   Abdominal   Peds negative pediatric ROS (+)  Hematology negative hematology ROS (+)   Anesthesia Other Findings Restless leg syn  Reproductive/Obstetrics negative OB ROS                            Lab Results  Component Value Date   WBC 7.7 02/26/2017   HGB 14.8 02/26/2017   HCT 42.9 02/26/2017   MCV 96.6 02/26/2017   PLT 287 02/26/2017   Lab Results  Component Value Date   CREATININE 0.80 02/26/2017   BUN 16 02/26/2017   NA 138 02/26/2017   K 4.1 02/26/2017   CL 105 02/26/2017   CO2 27 02/26/2017    Anesthesia Physical  Anesthesia Plan  ASA: III  Anesthesia Plan: General   Post-op Pain Management:    Induction: Intravenous  Airway Management Planned: Oral ETT  Additional Equipment:   Intra-op Plan:   Post-operative Plan: Possible Post-op intubation/ventilation  Informed Consent: I have reviewed the patients History and Physical, chart, labs and discussed the procedure including the risks, benefits and alternatives for the proposed anesthesia with the patient or authorized representative  who has indicated his/her understanding and acceptance.   Dental advisory given  Plan Discussed with: CRNA, Surgeon and Anesthesiologist  Anesthesia Plan Comments:       Anesthesia Quick Evaluation

## 2017-03-11 NOTE — H&P (Addendum)
CC:  No chief complaint on file. Neck and bilateral arm pain  HPI: Albert Hubbard is a 56 y.o. male with multilevel cervical spondylosis with radiculopathy.  He has left arm weakness and bilateral pain, tingling, and numbness.  PMH: Past Medical History:  Diagnosis Date  . Anxiety   . Arthritis   . Chronic back pain   . Depression    takes Sertaline daily  . Dyspnea    rarely but when does it's with exertion  . Hard of hearing   . Headache   . Peripheral neuropathy    takes Gabapentin daily  . Restless leg syndrome    takes Requip daily    PSH: Past Surgical History:  Procedure Laterality Date  . HERNIA REPAIR    . INNER EAR SURGERY    . MOUTH SURGERY    . TONSILLECTOMY      SH: Social History  Substance Use Topics  . Smoking status: Former Games developermoker  . Smokeless tobacco: Never Used     Comment: stopped smoking over a yr ago  . Alcohol use Yes     Comment: 6 pks a week    MEDS: Prior to Admission medications   Medication Sig Start Date End Date Taking? Authorizing Provider  diclofenac (VOLTAREN) 75 MG EC tablet Take 75 mg by mouth every evening.  12/03/16  Yes [provider]  gabapentin (NEURONTIN) 300 MG capsule Take 300 mg by mouth at bedtime.  12/25/16  Yes [provider]  rotigotine (NEUPRO) 4 MG/24HR Place 1 patch onto the skin daily.   Yes [provider]  sertraline (ZOLOFT) 100 MG tablet Take 50 mg by mouth at bedtime.  12/08/16  Yes [provider]  Pseudoephedrine HCl (SUPHEDRIN PO) Take 1 tablet by mouth daily as needed (allergies).    [provider]  rOPINIRole (REQUIP) 0.25 MG tablet Take 0.25 mg by mouth daily as needed.    [provider]    ALLERGY: Allergies  Allergen Reactions  . Cortisone Other (See Comments)    Unknown Patient thinks he has had this since with no issues    ROS: ROS  NEUROLOGIC EXAM: Awake, alert, oriented Memory and concentration grossly intact Speech fluent,  appropriate CN grossly intact Motor exam: Upper Extremities Deltoid Bicep Tricep Grip  Right 4/5 5/5 5/5 5/5  Left 3/5 4/5 3/5 4/5   Lower Extremity IP Quad PF DF EHL  Right 5/5 5/5 5/5 5/5 5/5  Left 5/5 5/5 5/5 5/5 5/5   Sensation grossly intact to LT  IMAGING: No new imaging  IMPRESSION: - 56 y.o. male with cervical spondylosis with radiculopathy and deficits as above  PLAN: - C4-7 ACDF today; C3-T1 decompression with foraminotomies and internal fixation and fusion tomorrow - Risks, benefits, and alternatives discussed; he wishes to proceed.

## 2017-03-11 NOTE — Op Note (Signed)
03/11/2017  3:44 PM  PATIENT:  Albert Hubbard  56 y.o. male  PRE-OPERATIVE DIAGNOSIS:  Cervical spondylosis with radiculopathy  POST-OPERATIVE DIAGNOSIS:  Same  PROCEDURE:  C4-7 anterior discectomy with fusion and plate fixation  SURGEON:  Hulan Saas, MD  ASSISTANTS: Donalee Citrin, MD  ANESTHESIA:   General  DRAINS: Medium hemovac   SPECIMEN:  None  INDICATION FOR PROCEDURE: 56 year old male with neck, shoulder, and arm pain and weakness.  Patient understood the risks, benefits, and alternatives and potential outcomes and wished to proceed.  PROCEDURE DETAILS: Patient was brought to the operating room placed under general endotracheal anesthesia. Patient was placed in the supine position on the operating room table. The neck was prepped with betadine and chloraprep and draped in a sterile fashion.   A transverse incision was made on the right side of the neck. Dissection was carried down thru the subcutaneous tissue and the platysma was elevated, opened vertically, and undermined with Metzenbaum scissors. Dissection was then carried out thru an avascular plane leaving the sternocleidomastoid, carotid artery, and jugular vein laterally and the trachea and esophagus medially. The ventral aspect of the vertebral column was identified and a localizing x-ray was taken. The C4-5 level was identified. The longus colli muscles were then elevated and the retractor was placed. The annulus was incised and the disc space entered. Discectomy was performed with micro-curettes and pituitary and kerrison rongeurs. I then used the high-speed drill to drill the endplates down to the level of the posterior longitudinal ligament. The operating microscope was draped and brought into the field provided additional magnification, illumination and visualization. Utilizing microsurgical technique, discectomy was continued posteriorly thru the disc space. Posterior longitudinal ligament was opened with a nerve  hook, and then removed along with disc herniation and osteophytes, decompressing the spinal canal and thecal sac. We then continued to remove osteophytic overgrowth and disc material decompressing the neural foramina and exiting nerve roots bilaterally. So by both visualization and palpation we felt we had an adequate decompression of the neural elements. We then measured the height of the intravertebral disc space and selected a trabecular metal interbody cage packed with equivabone. It was then gently positioned in the intravertebral disc space and countersunk.   The superior distraction pin was then removed and bone bleeding was stopped with bone wax. The distraction pin was inserted at the next inferior level. The annulus was incised and the disc space entered. Discectomy was performed with micro-curettes and pituitary and kerrison rongeurs. I then used the high-speed drill to drill the endplates down to the level of the posterior longitudinal ligament. The operating microscope was returned to the field.  The discectomy was continued posteriorly thru the disc space. Posterior longitudinal ligament was opened with a nerve hook, and then removed along with disc herniation and osteophytes, decompressing the spinal canal and thecal sac. We then continued to remove osteophytic overgrowth and disc material decompressing the neural foramina and exiting nerve roots bilaterally. So by both visualization and palpation we felt we had an adequate decompression of the neural elements. We then measured the height of the intravertebral disc space and selected a second interbody cage which was packed with equivabone. It was then gently positioned in the intravertebral disc space and countersunk.   The superior distraction pin was then again removed and bone bleeding was stopped with bone wax. The distraction pin was inserted at the most inferior level. The annulus was incised and the disc space entered. Discectomy was  performed with micro-curettes and pituitary and kerrison rongeurs. I then used the high-speed drill to drill the endplates down to the level of the posterior longitudinal ligament. The operating microscope was returned to the field.  The discectomy was continued posteriorly thru the disc space. Posterior longitudinal ligament was opened with a nerve hook, and then removed along with disc herniation and osteophytes, decompressing the spinal canal and thecal sac. We then continued to remove osteophytic overgrowth and disc material decompressing the neural foramina and exiting nerve roots bilaterally. So by both visualization and palpation we felt we had an adequate decompression of the neural elements. We then measured the height of the intravertebral disc space and selected a third interbody cage which was packed with equivabone. It was then gently positioned in the intravertebral disc space and countersunk.   I then inserted a titanium plate and placed six variable angle screws into the three superior vertebral bodies and two fixed angle screws at the inferior level and locked them into position. The wound was irrigated with bacitracin solution, checked for hemostasis which was established and confirmed. Once meticulous hemostasis was achieved a medium hemovac drain was placed. The platysma was closed with interrupted 3-0 undyed Vicryl suture, the subcuticular layer was closed with running monocryl suture. The skin edges were approximated with dermabond. The drapes were removed. A sterile dressing was applied. The patient was then awakened from general anesthesia and transferred to the recovery room in stable condition. At the end of the procedure all sponge, needle and instrument counts were correct.  PATIENT DISPOSITION:  PACU - hemodynamically stable. He will return to the OR tomorrow for C3-T1 posterior decompression, fixation, and fusion.   Delay start of Pharmacological VTE agent (>24hrs) due to surgical  blood loss or risk of bleeding:  yes

## 2017-03-11 NOTE — Transfer of Care (Signed)
Immediate Anesthesia Transfer of Care Note  Patient: Albert Hubbard  Procedure(s) Performed: Procedure(s) with comments: Stage 1: C4-7 Anterior cervical discectomy with fusion and plate fixation (N/A) - Stage 1: C4-7 Anterior cervical discectomy with fusion and plate fixation  Patient Location: PACU  Anesthesia Type:General  Level of Consciousness: awake and alert   Airway & Oxygen Therapy: Patient Spontanous Breathing and Patient connected to nasal cannula oxygen  Post-op Assessment: Report given to RN, Post -op Vital signs reviewed and stable and Patient moving all extremities X 4  Post vital signs: Reviewed and stable  Last Vitals:  Vitals:   03/11/17 1019 03/11/17 1602  BP: 114/79 123/84  Pulse: 71 93  Resp: 18 14  Temp: 36.7 C 36.1 C    Last Pain:  Vitals:   03/11/17 1047  TempSrc:   PainSc: 7       Patients Stated Pain Goal: 7 (03/11/17 1047)  Complications: No apparent anesthesia complications

## 2017-03-12 ENCOUNTER — Encounter (HOSPITAL_COMMUNITY)
Admission: RE | Disposition: A | Discharge status: Home / Self Care | Payer: Self-pay | Source: Ambulatory Visit | Attending: Neurological Surgery

## 2017-03-12 ENCOUNTER — Encounter (HOSPITAL_COMMUNITY): Payer: Self-pay | Admitting: Neurological Surgery

## 2017-03-12 ENCOUNTER — Inpatient Hospital Stay (HOSPITAL_COMMUNITY): Payer: Medicaid Other | Admitting: Anesthesiology

## 2017-03-12 ENCOUNTER — Inpatient Hospital Stay (HOSPITAL_COMMUNITY): Admission: RE | Admit: 2017-03-12 | Payer: Medicaid Other | Source: Ambulatory Visit | Admitting: Neurological Surgery

## 2017-03-12 ENCOUNTER — Inpatient Hospital Stay (HOSPITAL_COMMUNITY): Payer: Medicaid Other

## 2017-03-12 HISTORY — PX: POSTERIOR CERVICAL FUSION/FORAMINOTOMY: SHX5038

## 2017-03-12 LAB — GLUCOSE, CAPILLARY: Glucose-Capillary: 267 mg/dL — ABNORMAL HIGH (ref 65–99)

## 2017-03-12 SURGERY — POSTERIOR CERVICAL FUSION/FORAMINOTOMY LEVEL 5
Anesthesia: General

## 2017-03-12 MED ORDER — MIDAZOLAM HCL 2 MG/2ML IJ SOLN
INTRAMUSCULAR | Status: DC | PRN
  Administered 2017-03-12: 2 mg via INTRAVENOUS

## 2017-03-12 MED ORDER — FENTANYL CITRATE (PF) 250 MCG/5ML IJ SOLN
INTRAMUSCULAR | Status: DC | PRN
  Administered 2017-03-12 (×2): 50 ug via INTRAVENOUS
  Administered 2017-03-12: 150 ug via INTRAVENOUS

## 2017-03-12 MED ORDER — THROMBIN 5000 UNITS EX SOLR
CUTANEOUS | Status: DC | PRN
  Administered 2017-03-12: 10000 [IU] via TOPICAL

## 2017-03-12 MED ORDER — LIDOCAINE-EPINEPHRINE 2 %-1:100000 IJ SOLN
INTRAMUSCULAR | Status: DC | PRN
  Administered 2017-03-12: 10 mL via INTRADERMAL

## 2017-03-12 MED ORDER — LIDOCAINE 2% (20 MG/ML) 5 ML SYRINGE
INTRAMUSCULAR | Status: DC | PRN
  Administered 2017-03-12: 100 mg via INTRAVENOUS

## 2017-03-12 MED ORDER — LACTATED RINGERS IV SOLN
INTRAVENOUS | Status: DC | PRN
  Administered 2017-03-12 (×2): via INTRAVENOUS

## 2017-03-12 MED ORDER — PHENYLEPHRINE HCL 10 MG/ML IJ SOLN
INTRAVENOUS | Status: DC | PRN
  Administered 2017-03-12: 20 ug/min via INTRAVENOUS

## 2017-03-12 MED ORDER — GELATIN ABSORBABLE MT POWD
OROMUCOSAL | Status: DC | PRN
  Administered 2017-03-12: 09:00:00 via TOPICAL

## 2017-03-12 MED ORDER — SODIUM CHLORIDE 0.9 % IJ SOLN
INTRAMUSCULAR | Status: AC
  Filled 2017-03-12: qty 10

## 2017-03-12 MED ORDER — SODIUM CHLORIDE 0.9 % IJ SOLN
INTRAMUSCULAR | Status: DC | PRN
  Administered 2017-03-12: 10 mL

## 2017-03-12 MED ORDER — ROCURONIUM BROMIDE 10 MG/ML (PF) SYRINGE
PREFILLED_SYRINGE | INTRAVENOUS | Status: DC | PRN
  Administered 2017-03-12: 20 mg via INTRAVENOUS
  Administered 2017-03-12: 50 mg via INTRAVENOUS
  Administered 2017-03-12: 30 mg via INTRAVENOUS
  Administered 2017-03-12: 20 mg via INTRAVENOUS

## 2017-03-12 MED ORDER — DEXAMETHASONE SODIUM PHOSPHATE 10 MG/ML IJ SOLN
INTRAMUSCULAR | Status: DC | PRN
  Administered 2017-03-12: 10 mg via INTRAVENOUS

## 2017-03-12 MED ORDER — VANCOMYCIN HCL 1000 MG IV SOLR
INTRAVENOUS | Status: DC | PRN
  Administered 2017-03-12: 1000 mg via TOPICAL

## 2017-03-12 MED ORDER — 0.9 % SODIUM CHLORIDE (POUR BTL) OPTIME
TOPICAL | Status: DC | PRN
  Administered 2017-03-12: 1000 mL

## 2017-03-12 MED ORDER — BACITRACIN ZINC 500 UNIT/GM EX OINT
TOPICAL_OINTMENT | CUTANEOUS | Status: DC | PRN
  Administered 2017-03-12: 1 via TOPICAL

## 2017-03-12 MED ORDER — BUPIVACAINE LIPOSOME 1.3 % IJ SUSP
20.0000 mL | INTRAMUSCULAR | Status: AC
Start: 2017-03-12 — End: 2017-03-12
  Administered 2017-03-12: 20 mL
  Filled 2017-03-12: qty 20

## 2017-03-12 MED ORDER — BUPIVACAINE HCL 0.5 % IJ SOLN
INTRAMUSCULAR | Status: DC | PRN
  Administered 2017-03-12: 10 mL

## 2017-03-12 MED ORDER — SUGAMMADEX SODIUM 200 MG/2ML IV SOLN
INTRAVENOUS | Status: DC | PRN
  Administered 2017-03-12: 160 mg via INTRAVENOUS

## 2017-03-12 MED ORDER — HEMOSTATIC AGENTS (NO CHARGE) OPTIME
TOPICAL | Status: DC | PRN
  Administered 2017-03-12: 1 via TOPICAL

## 2017-03-12 MED ORDER — SODIUM CHLORIDE 0.9 % IR SOLN
Status: DC | PRN
  Administered 2017-03-12: 500 mL

## 2017-03-12 MED ORDER — PROPOFOL 10 MG/ML IV BOLUS
INTRAVENOUS | Status: DC | PRN
  Administered 2017-03-12: 140 mg via INTRAVENOUS
  Administered 2017-03-12: 20 mg via INTRAVENOUS

## 2017-03-12 MED ORDER — ARTIFICIAL TEARS OPHTHALMIC OINT
TOPICAL_OINTMENT | OPHTHALMIC | Status: DC | PRN
  Administered 2017-03-12: 1 via OPHTHALMIC

## 2017-03-12 MED ORDER — HYDROMORPHONE HCL 1 MG/ML IJ SOLN
0.2500 mg | INTRAMUSCULAR | Status: DC | PRN
  Administered 2017-03-12 (×2): 0.5 mg via INTRAVENOUS

## 2017-03-12 MED ORDER — PROMETHAZINE HCL 25 MG/ML IJ SOLN: 6.2500 mg | INTRAMUSCULAR | Status: DC | PRN

## 2017-03-12 SURGICAL SUPPLY — 90 items
BAG DECANTER FOR FLEXI CONT (MISCELLANEOUS) ×2 IMPLANT
BENZOIN TINCTURE PRP APPL 2/3 (GAUZE/BANDAGES/DRESSINGS) ×4 IMPLANT
BLADE CLIPPER SURG (BLADE) ×2 IMPLANT
BLADE ULTRA TIP 2M (BLADE) IMPLANT
BONE EQUIVA 10CC (Bone Implant) ×2 IMPLANT
BUR MATCHSTICK NEURO 3.0 LAGG (BURR) ×2 IMPLANT
BUR PRECISION FLUTE 5.0 (BURR) ×2 IMPLANT
CANISTER SUCT 3000ML PPV (MISCELLANEOUS) ×2 IMPLANT
CAP CLSR POST CERV (Cap) ×24 IMPLANT
CARTRIDGE OIL MAESTRO DRILL (MISCELLANEOUS) ×1 IMPLANT
CHLORAPREP W/TINT 26ML (MISCELLANEOUS) ×2 IMPLANT
CONT SPEC 4OZ CLIKSEAL STRL BL (MISCELLANEOUS) ×4 IMPLANT
COVER BACK TABLE 60X90IN (DRAPES) IMPLANT
DECANTER SPIKE VIAL GLASS SM (MISCELLANEOUS) IMPLANT
DERMABOND ADVANCED (GAUZE/BANDAGES/DRESSINGS) ×1
DERMABOND ADVANCED .7 DNX12 (GAUZE/BANDAGES/DRESSINGS) ×1 IMPLANT
DIFFUSER DRILL AIR PNEUMATIC (MISCELLANEOUS) ×2 IMPLANT
DRAPE C-ARM 42X72 X-RAY (DRAPES) ×2 IMPLANT
DRAPE C-ARMOR (DRAPES) ×2 IMPLANT
DRAPE MICROSCOPE LEICA (MISCELLANEOUS) IMPLANT
DRAPE POUCH INSTRU U-SHP 10X18 (DRAPES) ×2 IMPLANT
DRSG OPSITE POSTOP 4X6 (GAUZE/BANDAGES/DRESSINGS) ×2 IMPLANT
DRSG OPSITE POSTOP 4X8 (GAUZE/BANDAGES/DRESSINGS) ×2 IMPLANT
DRSG PAD ABDOMINAL 8X10 ST (GAUZE/BANDAGES/DRESSINGS) IMPLANT
ELECT REM PT RETURN 9FT ADLT (ELECTROSURGICAL) ×2
ELECTRODE REM PT RTRN 9FT ADLT (ELECTROSURGICAL) ×1 IMPLANT
EVACUATOR 1/8 PVC DRAIN (DRAIN) ×2 IMPLANT
GAUZE SPONGE 4X4 12PLY STRL (GAUZE/BANDAGES/DRESSINGS) ×2 IMPLANT
GAUZE SPONGE 4X4 16PLY XRAY LF (GAUZE/BANDAGES/DRESSINGS) IMPLANT
GLOVE BIO SURGEON STRL SZ7 (GLOVE) IMPLANT
GLOVE BIO SURGEON STRL SZ8 (GLOVE) ×2 IMPLANT
GLOVE BIOGEL PI IND STRL 7.0 (GLOVE) IMPLANT
GLOVE BIOGEL PI IND STRL 7.5 (GLOVE) ×1 IMPLANT
GLOVE BIOGEL PI INDICATOR 7.0 (GLOVE)
GLOVE BIOGEL PI INDICATOR 7.5 (GLOVE) ×1
GLOVE SS BIOGEL STRL SZ 7.5 (GLOVE) ×3 IMPLANT
GLOVE SUPERSENSE BIOGEL SZ 7.5 (GLOVE) ×3
GOWN STRL REUS W/ TWL LRG LVL3 (GOWN DISPOSABLE) ×1 IMPLANT
GOWN STRL REUS W/ TWL XL LVL3 (GOWN DISPOSABLE) ×1 IMPLANT
GOWN STRL REUS W/TWL 2XL LVL3 (GOWN DISPOSABLE) ×4 IMPLANT
GOWN STRL REUS W/TWL LRG LVL3 (GOWN DISPOSABLE) ×1
GOWN STRL REUS W/TWL XL LVL3 (GOWN DISPOSABLE) ×1
HEMOSTAT POWDER KIT SURGIFOAM (HEMOSTASIS) ×2 IMPLANT
HEMOSTAT POWDER SURGIFOAM 1G (HEMOSTASIS) IMPLANT
HEMOSTAT SURGICEL 2X14 (HEMOSTASIS) ×2 IMPLANT
KIT BASIN OR (CUSTOM PROCEDURE TRAY) ×2 IMPLANT
KIT INFUSE SMALL (Orthopedic Implant) ×2 IMPLANT
KIT ROOM TURNOVER OR (KITS) ×2 IMPLANT
MARKER SKIN DUAL TIP RULER LAB (MISCELLANEOUS) ×2 IMPLANT
NEEDLE HYPO 18GX1.5 BLUNT FILL (NEEDLE) ×2 IMPLANT
NEEDLE HYPO 21X1.5 SAFETY (NEEDLE) ×2 IMPLANT
NEEDLE HYPO 25X1 1.5 SAFETY (NEEDLE) ×2 IMPLANT
NEEDLE SPNL 22GX3.5 QUINCKE BK (NEEDLE) ×2 IMPLANT
NS IRRIG 1000ML POUR BTL (IV SOLUTION) ×2 IMPLANT
OIL CARTRIDGE MAESTRO DRILL (MISCELLANEOUS) ×2
PACK LAMINECTOMY NEURO (CUSTOM PROCEDURE TRAY) ×2 IMPLANT
PACK UNIVERSAL I (CUSTOM PROCEDURE TRAY) ×2 IMPLANT
PATTIES SURGICAL .5X1.5 (GAUZE/BANDAGES/DRESSINGS) IMPLANT
PIN MAYFIELD SKULL DISP (PIN) ×2 IMPLANT
ROD CVD VIRAGE 3.5X80 (Rod) ×4 IMPLANT
RUBBERBAND STERILE (MISCELLANEOUS) IMPLANT
SCREW VIRAGE 3.5X14 (Screw) ×20 IMPLANT
SCREW VIRAGE 3.5X24MM (Screw) ×4 IMPLANT
SEALER BIPOLAR AQUA 2.3 (INSTRUMENTS) ×2 IMPLANT
SPONGE INTESTINAL PEANUT (DISPOSABLE) ×2 IMPLANT
SPONGE NEURO XRAY DETECT 1X3 (DISPOSABLE) ×2 IMPLANT
SPONGE SURGIFOAM ABS GEL SZ50 (HEMOSTASIS) ×2 IMPLANT
STAPLER VISISTAT 35W (STAPLE) ×2 IMPLANT
STRIP CLOSURE SKIN 1/2X4 (GAUZE/BANDAGES/DRESSINGS) ×2 IMPLANT
STRIP SURGICAL 1 X 6 IN (GAUZE/BANDAGES/DRESSINGS) IMPLANT
STRIP SURGICAL 1/2 X 6 IN (GAUZE/BANDAGES/DRESSINGS) IMPLANT
STRIP SURGICAL 1/4 X 6 IN (GAUZE/BANDAGES/DRESSINGS) IMPLANT
STRIP SURGICAL 3/4 X 6 IN (GAUZE/BANDAGES/DRESSINGS) IMPLANT
SUT STRATAFIX 1PDS 45CM VIOLET (SUTURE) ×2 IMPLANT
SUT STRATAFIX MNCRL+ 3-0 PS-2 (SUTURE) ×1
SUT STRATAFIX MONOCRYL 3-0 (SUTURE) ×1
SUT STRATAFIX SPIRAL + 2-0 (SUTURE) ×2 IMPLANT
SUT VIC AB 0 CT1 18XCR BRD8 (SUTURE) ×1 IMPLANT
SUT VIC AB 0 CT1 8-18 (SUTURE) ×1
SUT VIC AB 2-0 CT1 18 (SUTURE) ×2 IMPLANT
SUT VIC AB 3-0 SH 8-18 (SUTURE) ×2 IMPLANT
SUTURE STRATFX MNCRL+ 3-0 PS-2 (SUTURE) ×1 IMPLANT
SYR 30ML LL (SYRINGE) ×4 IMPLANT
SYR 3ML LL SCALE MARK (SYRINGE) IMPLANT
SYRINGE 20CC LL (MISCELLANEOUS) ×2 IMPLANT
TOWEL GREEN STERILE (TOWEL DISPOSABLE) ×2 IMPLANT
TOWEL GREEN STERILE FF (TOWEL DISPOSABLE) ×2 IMPLANT
TRAY FOLEY W/METER SILVER 16FR (SET/KITS/TRAYS/PACK) IMPLANT
UNDERPAD 30X30 (UNDERPADS AND DIAPERS) ×2 IMPLANT
WATER STERILE IRR 1000ML POUR (IV SOLUTION) ×2 IMPLANT

## 2017-03-12 NOTE — Anesthesia Procedure Notes (Signed)
Procedure Name: Intubation Date/Time: 03/12/2017 7:47 AM Performed by: Burt EkURNER, Sheilyn Boehlke ASHLEY Pre-anesthesia Checklist: Patient identified, Emergency Drugs available, Patient being monitored and Suction available Patient Re-evaluated:Patient Re-evaluated prior to inductionOxygen Delivery Method: Circle system utilized Preoxygenation: Pre-oxygenation with 100% oxygen Intubation Type: IV induction Ventilation: Mask ventilation without difficulty Laryngoscope Size: Glidescope and 3 Grade View: Grade I Tube type: Oral Tube size: 7.5 mm Number of attempts: 1 Airway Equipment and Method: Stylet and Video-laryngoscopy Placement Confirmation: ETT inserted through vocal cords under direct vision,  positive ETCO2 and breath sounds checked- equal and bilateral Secured at: 23 cm Tube secured with: Tape Dental Injury: Teeth and Oropharynx as per pre-operative assessment

## 2017-03-12 NOTE — Anesthesia Postprocedure Evaluation (Signed)
Anesthesia Post Note  Patient: Verne CarrowWilliam Auvil  Procedure(s) Performed: Procedure(s) (LRB): Stage 2: Cervical three-Thoracic one Laminectiomies with foraminotomies, fixation and fusion (N/A)     Patient location during evaluation: PACU Anesthesia Type: General Level of consciousness: awake and alert Pain management: pain level controlled Vital Signs Assessment: post-procedure vital signs reviewed and stable Respiratory status: spontaneous breathing, nonlabored ventilation, respiratory function stable and patient connected to nasal cannula oxygen Cardiovascular status: blood pressure returned to baseline and stable Postop Assessment: no signs of nausea or vomiting Anesthetic complications: no    Last Vitals:  Vitals:   03/12/17 1230 03/12/17 1300  BP: 114/79 127/79  Pulse: 81 86  Resp: 14 16  Temp:  36.9 C    Last Pain:  Vitals:   03/12/17 1300  TempSrc: Oral  PainSc:                  Kennieth RadFitzgerald, Aviendha Azbell E

## 2017-03-12 NOTE — Anesthesia Postprocedure Evaluation (Signed)
Anesthesia Post Note  Patient: Albert Hubbard  Procedure(s) Performed: Procedure(s) (LRB): Stage 1: C4-7 Anterior cervical discectomy with fusion and plate fixation (N/A)  Patient location during evaluation: PACU Anesthesia Type: General Level of consciousness: awake and alert Pain management: pain level controlled Vital Signs Assessment: post-procedure vital signs reviewed and stable Respiratory status: spontaneous breathing, nonlabored ventilation, respiratory function stable and patient connected to nasal cannula oxygen Cardiovascular status: blood pressure returned to baseline and stable Postop Assessment: no signs of nausea or vomiting Anesthetic complications: no       Last Vitals:  Vitals:   03/12/17 0416 03/12/17 0606  BP: (!) 93/58 112/71  Pulse: 100 88  Resp: 18 20  Temp: 37 C 36.8 C    Last Pain:  Vitals:   03/12/17 0606  TempSrc: Oral  PainSc:                  Catheryn Baconyan P Sherry Blackard

## 2017-03-12 NOTE — Transfer of Care (Signed)
Immediate Anesthesia Transfer of Care Note  Patient: Verne CarrowWilliam Droke  Procedure(s) Performed: Procedure(s) with comments: Stage 2: Cervical three-Thoracic one Laminectiomies with foraminotomies, fixation and fusion (N/A) - Stage 2: C3-T1 Laminectiomies with foraminotomies, fixation and fusion  Patient Location: PACU  Anesthesia Type:General  Level of Consciousness: awake, alert , oriented and patient cooperative  Airway & Oxygen Therapy: Patient Spontanous Breathing and Patient connected to nasal cannula oxygen  Post-op Assessment: Report given to RN, Post -op Vital signs reviewed and stable and Patient moving all extremities X 4  Post vital signs: Reviewed and stable  Last Vitals:  Vitals:   03/12/17 0416 03/12/17 0606  BP: (!) 93/58 112/71  Pulse: 100 88  Resp: 18 20  Temp: 37 C 36.8 C    Last Pain:  Vitals:   03/12/17 0606  TempSrc: Oral  PainSc:       Patients Stated Pain Goal: 2 (03/11/17 2114)  Complications: No apparent anesthesia complications

## 2017-03-12 NOTE — Brief Op Note (Signed)
03/11/2017 - 03/12/2017  11:00 AM  PATIENT:  Albert Hubbard  56 y.o. male  PRE-OPERATIVE DIAGNOSIS:  Other spondylosis with radiculopathy, Cervical region  POST-OPERATIVE DIAGNOSIS:  Other spondylosis with radiculopathy, Cervical region  PROCEDURE:  Procedure(s) with comments: Stage 2: Cervical three-Thoracic one Laminectiomies with foraminotomies, fixation and fusion (N/A) - Stage 2: C3-T1 Laminectiomies with foraminotomies, fixation and fusion  SURGEON:  Surgeon(s) and Role:    * Ditty, Loura HaltBenjamin Jared, MD - Primary    * Tia AlertJones, David S, MD - Assisting  PHYSICIAN ASSISTANT:   ASSISTANTS: Marikay Alaravid Jones, MD  ANESTHESIA:   general  EBL:  Total I/O In: 1000 [I.V.:1000] Out: 470 [Urine:320; Blood:150]  BLOOD ADMINISTERED:none  DRAINS: Medium hemovac   LOCAL MEDICATIONS USED:  MARCAINE    and LIDOCAINE   SPECIMEN:  No Specimen  DISPOSITION OF SPECIMEN:  N/A  COUNTS:  YES  TOURNIQUET:  * No tourniquets in log *  DICTATION: .Dragon Dictation  PLAN OF CARE: Admit to inpatient   PATIENT DISPOSITION:  PACU - hemodynamically stable.   Delay start of Pharmacological VTE agent (>24hrs) due to surgical blood loss or risk of bleeding: yes

## 2017-03-12 NOTE — Progress Notes (Signed)
No acute overnight events Upper and lower extremity strength unchanged from yesterday Incision c/d/i Ready for stage 2 today

## 2017-03-12 NOTE — Evaluation (Signed)
Physical Therapy Evaluation Patient Details Name: Albert Hubbard MRN: [id number] DOB: 1960-12-01 Today's Date: 03/12/2017   History of Present Illness  Pt is a 56 yo male admitted for elective C4-C7 anterior discectomy with fusion and place insertion on 03/11/17 and C3-T1 laminectomy, foraminotomy, fixation and fusion on 03/12/17. PMH significant for anxiety, OA, chornic back pain, HOH.   Clinical Impression  Pt presents with the above diagnosis and below deficits for therapy evaluation. Prior to admission, pt was completely independent and able to do for himself living alone. Pt now requires Min guard for mobility in order to safely perform gait and bed mobility. Pt will be going home with his friend and family who will be available to assist with recovery. Pt will require additional acute PT services in order to address stair negotiation and ensure safe gait prior to discharge.     Follow Up Recommendations No PT follow up    Equipment Recommendations  None recommended by PT    Recommendations for Other Services       Precautions / Restrictions Precautions Precautions: Cervical Required Braces or Orthoses: Cervical Brace Cervical Brace: Hard collar;Other (comment);For comfort (per order may remove in bed, going to bathroom, bathing) Restrictions Weight Bearing Restrictions: No      Mobility  Bed Mobility Overal bed mobility: Needs Assistance Bed Mobility: Supine to Sit;Sit to Supine     Supine to sit: Min guard Sit to supine: Min guard   General bed mobility comments: Min guard to assist trunk to EOB in sitting, Min guard to positiong LE's in bed  Transfers Overall transfer level: Needs assistance Equipment used: None Transfers: Sit to/from Stand Sit to Stand: Min guard         General transfer comment: Min guard for safety from EOB  Ambulation/Gait Ambulation/Gait assistance: Min guard;Min assist Ambulation Distance (Feet): 150 Feet Assistive device: None (IV  pole) Gait Pattern/deviations: Step-through pattern;Decreased step length - right;Decreased step length - left Gait velocity: decrease Gait velocity interpretation: Below normal speed for age/gender General Gait Details: Mild unsteady gait with 1 hand on IV pole. Pt with c/o being unable to widen his BOS to ambulate. Gait becomes more unsteady as gait distance increases  Stairs            Wheelchair Mobility    Modified Rankin (Stroke Patients Only)       Balance Overall balance assessment: Needs assistance Sitting-balance support: No upper extremity supported;Feet supported Sitting balance-Leahy Scale: Good     Standing balance support: No upper extremity supported Standing balance-Leahy Scale: Fair                               Pertinent Vitals/Pain Pain Assessment: 0-10 Pain Score: 7  Pain Location: cervical region Pain Descriptors / Indicators: Aching;Grimacing;Guarding Pain Intervention(s): Monitored during session;Premedicated before session;Repositioned    Home Living Family/patient expects to be discharged to:: Private residence Living Arrangements: Non-relatives/Friends;Other (Comment) (going to stay with a friend and his family) Available Help at Discharge: Friend(s);Available 24 hours/day Type of Home: House Home Access: Stairs to enter Entrance Stairs-Rails: None Entrance Stairs-Number of Steps: 2-3 Home Layout: One level Home Equipment: None      Prior Function Level of Independence: Independent         Comments: completely independent and working      Hand Dominance   Dominant Hand: Right    Extremity/Trunk Assessment   Upper Extremity Assessment Upper Extremity Assessment: Defer  to OT evaluation    Lower Extremity Assessment Lower Extremity Assessment: Generalized weakness    Cervical / Trunk Assessment Cervical / Trunk Assessment: Other exceptions Cervical / Trunk Exceptions: cervical surgery   Communication    Communication: HOH;No difficulties  Cognition Arousal/Alertness: Awake/alert Behavior During Therapy: WFL for tasks assessed/performed Overall Cognitive Status: Within Functional Limits for tasks assessed                                        General Comments      Exercises     Assessment/Plan    PT Assessment Patient needs continued PT services  PT Problem List Decreased activity tolerance;Decreased balance;Decreased mobility;Pain       PT Treatment Interventions DME instruction;Gait training;Stair training;Functional mobility training;Therapeutic activities;Therapeutic exercise;Balance training;Patient/family education    PT Goals (Current goals can be found in the Care Plan section)  Acute Rehab PT Goals Patient Stated Goal: to have less pain and walk better PT Goal Formulation: With patient Time For Goal Achievement: 03/19/17 Potential to Achieve Goals: Good    Frequency Min 5X/week   Barriers to discharge        Co-evaluation               AM-PAC PT "6 Clicks" Daily Activity  Outcome Measure Difficulty turning over in bed (including adjusting bedclothes, sheets and blankets)?: None Difficulty moving from lying on back to sitting on the side of the bed? : A Little Difficulty sitting down on and standing up from a chair with arms (e.g., wheelchair, bedside commode, etc,.)?: A Little Help needed moving to and from a bed to chair (including a wheelchair)?: A Little Help needed walking in hospital room?: A Little Help needed climbing 3-5 steps with a railing? : A Little 6 Click Score: 19    End of Session Equipment Utilized During Treatment: Gait belt;Cervical collar Activity Tolerance: Patient limited by lethargy Patient left: in bed;with family/visitor present;with call bell/phone within reach Nurse Communication: Mobility status PT Visit Diagnosis: Unsteadiness on feet (R26.81);Difficulty in walking, not elsewhere classified (R26.2)     Time: 1510-1530 PT Time Calculation (min) (ACUTE ONLY): 20 min   Charges:   PT Evaluation $PT Eval Moderate Complexity: 1 Procedure     PT G Codes:        Colin BroachSabra M. Torrin Crihfield PT, DPT  [phone]   Roxy MannsSabra Marie Salene Mohamud 03/12/2017, 4:27 PM

## 2017-03-13 ENCOUNTER — Encounter (HOSPITAL_COMMUNITY): Payer: Self-pay | Admitting: Neurological Surgery

## 2017-03-13 MED ORDER — METHOCARBAMOL 750 MG PO TABS: 750.0000 mg | ORAL_TABLET | Freq: Four times a day (QID) | ORAL | 2 refills | Status: DC | PRN

## 2017-03-13 MED ORDER — GABAPENTIN 300 MG PO CAPS: 300.0000 mg | ORAL_CAPSULE | Freq: Three times a day (TID) | ORAL | 2 refills | Status: DC

## 2017-03-13 MED ORDER — HYDROCODONE-ACETAMINOPHEN 7.5-325 MG PO TABS: 1.0000 | ORAL_TABLET | ORAL | 0 refills | Status: DC | PRN

## 2017-03-13 NOTE — OR Nursing (Signed)
Late entry due to delay code documentation. 

## 2017-03-13 NOTE — Discharge Summary (Signed)
Date of Admission: 03/11/2017  Date of Discharge: 03/13/17  Admission Diagnosis: Cervical spondylosis with radiculopathy; foraminal stenosis C3-T1  Discharge Diagnosis: Same  Procedure Performed: C4-7 ACDF; C3-T1 posterior decompression and foraminotomies with fixation and fusion  Attending: Robyn Nohr, Loura HaltBenjamin Jared, MD  Hospital Course:  The patient was admitted and underwent the above listed procedures in a staged fashion.  He had an uncomplicated post-operative course.  He was discharged in stable condition.  Follow up: 3 weeks  Allergies as of 03/13/2017      Reactions   Cortisone Other (See Comments)   Unknown Patient thinks he has had this since with no issues      Medication List    TAKE these medications   diclofenac 75 MG EC tablet Commonly known as:  VOLTAREN Take 75 mg by mouth every evening.   gabapentin 300 MG capsule Commonly known as:  NEURONTIN Take 1 capsule (300 mg total) by mouth 3 (three) times daily. What changed:  when to take this   HYDROcodone-acetaminophen 7.5-325 MG tablet Commonly known as:  NORCO Take 1-2 tablets by mouth every 4 (four) hours as needed for moderate pain.   methocarbamol 750 MG tablet Commonly known as:  ROBAXIN Take 1 tablet (750 mg total) by mouth every 6 (six) hours as needed for muscle spasms.   rOPINIRole 0.25 MG tablet Commonly known as:  REQUIP Take 0.25 mg by mouth daily as needed.   rotigotine 4 MG/24HR Commonly known as:  NEUPRO Place 1 patch onto the skin daily.   sertraline 100 MG tablet Commonly known as:  ZOLOFT Take 50 mg by mouth at bedtime.   SUPHEDRIN PO Take 1 tablet by mouth daily as needed (allergies).

## 2017-03-13 NOTE — Progress Notes (Signed)
Doing well Strength unchanged from prior to surgery Incisions c/d/i D/c home

## 2017-03-13 NOTE — Evaluation (Signed)
Occupational Therapy Evaluation Patient Details Name: Albert Hubbard MRN: [id number] DOB: Feb 28, 1961 Today's Date: 03/13/2017    History of Present Illness Pt is a 56 yo male admitted for elective C4-C7 anterior discectomy with fusion and place insertion on 03/11/17 and C3-T1 laminectomy, foraminotomy, fixation and fusion on 03/12/17. PMH significant for anxiety, OA, chornic back pain, HOH.    Clinical Impression   Pt was admitted for the above.At baseline, pt was independent with adls.   He can complete adl with min A for donning shirt over aspen collar and min guard for gathering supplies.  Pt has bil hand weakness, L > R.  Will provide HEP prior to d/c.  Pt will have 24/7 assistance at home.    Follow Up Recommendations  DC plan and follow up therapy as arranged by surgeon    Equipment Recommendations  None recommended by OT (can borrow tub seat; will have 24/7 supervision)    Recommendations for Other Services       Precautions / Restrictions Precautions Precautions: Cervical Required Braces or Orthoses: Cervical Brace Cervical Brace: Hard collar;Other (comment);For comfort Restrictions Weight Bearing Restrictions: No      Mobility Bed Mobility               General bed mobility comments: sitting EOB  Transfers       Sit to Stand: Min guard         General transfer comment: for safety    Balance                                           ADL either performed or assessed with clinical judgement   ADL Overall ADL's : Needs assistance/impaired Eating/Feeding: Set up;Sitting   Grooming: Set up;Sitting   Upper Body Bathing: Set up;Sitting   Lower Body Bathing: Min guard;Sit to/from stand   Upper Body Dressing : Minimal assistance;Sitting   Lower Body Dressing: Min guard;Sit to/from stand   Toilet Transfer: Min guard;Ambulation [facility])   Toileting- Clothing Manipulation and Hygiene: Min guard;Sit to/from stand         General ADL  Comments: pt got dressed during OT.  Cued him for precautions when donning shirt; he will need assistance to tuck back of neck under his collar.  Provided built up foam for utensils, to see if this will help him.  He sometimes has difficulty cutting food.  Pt has not been having difficulty getting up from commode at home; ours is higher, but he feels he will be OK--vanity is next to commode.  Educated on tub bench vs seat.  He can borrow a seat and have assistance.  Reinforced keeping head still when he has brace off     Vision         Perception     Praxis      Pertinent Vitals/Pain Pain Score: 7  Pain Location: cervical region Pain Descriptors / Indicators: Aching;Grimacing;Guarding Pain Intervention(s): Limited activity within patient's tolerance;Monitored during session;Premedicated before session;Repositioned     Hand Dominance Right   Extremity/Trunk Assessment Upper Extremity Assessment Upper Extremity Assessment: RUE deficits/detail;LUE deficits/detail RUE Deficits / Details: bil UEs wfls to 90 degrees ROM.  R hand strength grossly3+/5 RUE Sensation:  (reports decreased sensation bil) LUE Deficits / Details: hand strength grossly 3-/5.  opposes to first digit with effort; second approximates thumb  Communication Communication Communication: HOH;No difficulties   Cognition Arousal/Alertness: Awake/alert Behavior During Therapy: WFL for tasks assessed/performed Overall Cognitive Status: Within Functional Limits for tasks assessed                                     General Comments       Exercises     Shoulder Instructions      Home Living Family/patient expects to be discharged to:: Private residence Living Arrangements: Non-relatives/Friends;Other (Comment) Available Help at Discharge: Friend(s);Available 24 hours/day               Bathroom Shower/Tub: Chief Strategy OfficerTub/shower unit   Bathroom Toilet: Standard     Home Equipment: Geophysicist/field seismologisthower  seat   Additional Comments: friend has shower seat he can use; he will have 24/7 assistance      Prior Functioning/Environment Level of Independence: Independent        Comments: completely independent and working         OT Problem List: Decreased coordination;Decreased strength;Pain;Impaired balance (sitting and/or standing);Decreased knowledge of use of DME or AE;Decreased knowledge of precautions      OT Treatment/Interventions: Self-care/ADL training;Balance training;Patient/family education;Therapeutic exercise;Therapeutic activities    OT Goals(Current goals can be found in the care plan section) Acute Rehab OT Goals Patient Stated Goal: to have less pain and walk better OT Goal Formulation: With patient Time For Goal Achievement: 03/14/17 Potential to Achieve Goals: Good ADL Goals Additional ADL Goal #1: pt will demonstrate current HEP and verbalize future exercises to grow with  OT Frequency: Min 1X/week   Barriers to D/C:            Co-evaluation              AM-PAC PT "6 Clicks" Daily Activity     Outcome Measure Help from another person eating meals?: A Little Help from another person taking care of personal grooming?: A Little Help from another person toileting, which includes using toliet, bedpan, or urinal?: A Little Help from another person bathing (including washing, rinsing, drying)?: A Little Help from another person to put on and taking off regular upper body clothing?: A Little Help from another person to put on and taking off regular lower body clothing?: A Little 6 Click Score: 18   End of Session    Activity Tolerance: Patient tolerated treatment well Patient left:  (EOB with daughter)  OT Visit Diagnosis: Unsteadiness on feet (R26.81)                Time: 1610-96041100-1125 OT Time Calculation (min): 25 min Charges:  OT General Charges $OT Visit: 1 Procedure OT Evaluation $OT Eval Low Complexity: 1 Procedure OT Treatments $Self  Care/Home Management : 8-22 mins G-Codes:     NealMaryellen Eleora Sutherland, OTR/L [phone] 03/13/2017  Marsheila Alejo 03/13/2017, 12:27 PM

## 2017-03-13 NOTE — Progress Notes (Signed)
   03/13/17 1227  OT Visit Information  Last OT Received On 03/13/17  Assistance Needed +1  History of Present Illness Pt is a 56 yo male admitted for elective C4-C7 anterior discectomy with fusion and place insertion on 03/11/17 and C3-T1 laminectomy, foraminotomy, fixation and fusion on 03/12/17. PMH significant for anxiety, OA, chornic back pain, HOH.   Precautions  Precautions Cervical  Required Braces or Orthoses Cervical Brace  Cervical Brace Hard collar;Other (comment);For comfort  Pain Assessment  Pain Score 7  Pain Location cervical region  Pain Descriptors / Indicators Aching;Grimacing;Guarding  Pain Intervention(s) Limited activity within patient's tolerance;Monitored during session  Cognition  Arousal/Alertness Awake/alert  Behavior During Therapy WFL for tasks assessed/performed  Overall Cognitive Status Within Functional Limits for tasks assessed  Restrictions  Weight Bearing Restrictions No  Exercises  Exercises Other exercises  Other Exercises  Other Exercises provided written HEP for bil UE strength, especially L.  He was provided with a squeeze ball.  Encouraged normal use of bil UEs to strengthen and work on opposing second and third digits to thumb on L.  Other Exercises provided fine motor coordination handout, using non-sharp objects focusing on activities that can be done with non-dominant hand. Also provided level one theraputty with written program to grow with as pt's follow up will likely be limited due to insurance  OT - End of Session  Activity Tolerance Patient tolerated treatment well  Patient left in bed (EOB)  OT Assessment/Plan  Follow Up Recommendations DC plan and follow up therapy as arranged by surgeon  OT Equipment None recommended by OT  AM-PAC OT "6 Clicks" Daily Activity Outcome Measure  Help from another person eating meals? 3  Help from another person taking care of personal grooming? 3  Help from another person toileting, which includes  using toliet, bedpan, or urinal? 3  Help from another person bathing (including washing, rinsing, drying)? 3  Help from another person to put on and taking off regular upper body clothing? 3  Help from another person to put on and taking off regular lower body clothing? 3  6 Click Score 18  ADL G Code Conversion CK  OT Goal Progression  Progress towards OT goals Goals met/education completed, patient discharged from OT  ADL Goals  Additional ADL Goal #1 pt will demonstrate current HEP and verbalize future exercises to grow with  OT Time Calculation  OT Start Time (ACUTE ONLY) 1205  OT Stop Time (ACUTE ONLY) 1220  OT Time Calculation (min) 15 min  OT General Charges  $OT Visit 1 Procedure  OT Treatments  $Therapeutic Exercise 8-22 mins  Lesle Chris, OTR/L [phone] 03/13/2017

## 2017-03-13 NOTE — Progress Notes (Signed)
Physical Therapy Treatment Patient Details Name: Albert CarrowWilliam Hubbard MRN: [id number] DOB: 1961-04-26 Today's Date: 03/13/2017    History of Present Illness Pt is a 56 yo male admitted for elective C4-C7 anterior discectomy with fusion and place insertion on 03/11/17 and C3-T1 laminectomy, foraminotomy, fixation and fusion on 03/12/17. PMH significant for anxiety, OA, chornic back pain, HOH.     PT Comments    Pt progressing towards physical therapy goals. Was able to perform transfers and ambulation with gross min guard assist for balance support and safety.  Pt was educated on proper AD use and recommended SPC vs QC. Pt also educated on brace wearing schedule, car transfer, and general safety with precautions. Will continue to follow and progress as able per POC.  Follow Up Recommendations  No PT follow up     Equipment Recommendations  [facility]  Recommendations for Other Services       Precautions / Restrictions Precautions Precautions: Cervical;Fall Required Braces or Orthoses: Cervical Brace Cervical Brace: Hard collar Restrictions Weight Bearing Restrictions: No    Mobility  Bed Mobility Overal bed mobility: Needs Assistance Bed Mobility: Supine to Sit;Sit to Supine     Supine to sit: Min guard     General bed mobility comments: Pt insisted on exiting bed with HOB all the way elevated to simulate him sitting in a recliner at home. Plans to sleep in recliner. Reaching for therapist's hand for support however was able to complete without assist.   Transfers Overall transfer level: Needs assistance Equipment used: None Transfers: Sit to/from Stand Sit to Stand: Min guard         General transfer comment: Close guard for safety.   Ambulation/Gait Ambulation/Gait assistance: Supervision;Min guard Ambulation Distance (Feet): 200 Feet Assistive device: Straight cane Gait Pattern/deviations: Step-through pattern;Decreased step length - right;Decreased step length - left Gait  velocity: decrease Gait velocity interpretation: Below normal speed for age/gender General Gait Details: Provided pt with [facility] for support. Continued to require occasional hands on guarding for safety but no real assist required.    Stairs Stairs: Yes   Stair Management: One rail Right;With cane;Forwards;Step to pattern Number of Stairs: 4 (2x2) General stair comments: VC's for sequencing and safety.   Wheelchair Mobility    Modified Rankin (Stroke Patients Only)       Balance Overall balance assessment: Needs assistance Sitting-balance support: No upper extremity supported;Feet supported Sitting balance-Leahy Scale: Good     Standing balance support: No upper extremity supported Standing balance-Leahy Scale: Fair                              Cognition Arousal/Alertness: Awake/alert Behavior During Therapy: WFL for tasks assessed/performed Overall Cognitive Status: Within Functional Limits for tasks assessed                                        Exercises Other Exercises Other Exercises: provided written HEP for bil UE strength, especially L.  He was provided with a squeeze ball.  Encouraged normal use of bil UEs to strengthen and work on opposing second and third digits to thumb on L. Other Exercises: provided fine motor coordination handout, using non-sharp objects focusing on activities that can be done with non-dominant hand. Also provided level one theraputty with written program to grow with as pt's follow up will likely be limited due to insurance  General Comments        Pertinent Vitals/Pain Pain Assessment: Faces Pain Score: 7  Faces Pain Scale: Hurts little more Pain Location: cervical region Pain Descriptors / Indicators: Aching;Grimacing;Guarding Pain Intervention(s): Limited activity within patient's tolerance;Monitored during session;Repositioned    Home Living Family/patient expects to be discharged to:: Private  residence Living Arrangements: Non-relatives/Friends;Other (Comment) Available Help at Discharge: Friend(s);Available 24 hours/day Type of Home: House Home Access: Stairs to enter Entrance Stairs-Rails: None Home Layout: One level Home Equipment: Shower seat Additional Comments: friend has shower seat he can use; he will have 24/7 assistance    Prior Function Level of Independence: Independent      Comments: completely independent and working    PT Goals (current goals can now be found in the care plan section) Acute Rehab PT Goals Patient Stated Goal: to have less pain and walk better PT Goal Formulation: With patient Time For Goal Achievement: 03/19/17 Potential to Achieve Goals: Good Progress towards PT goals: Progressing toward goals    Frequency    Min 5X/week      PT Plan Current plan remains appropriate    Co-evaluation              AM-PAC PT "6 Clicks" Daily Activity  Outcome Measure  Difficulty turning over in bed (including adjusting bedclothes, sheets and blankets)?: None Difficulty moving from lying on back to sitting on the side of the bed? : A Little Difficulty sitting down on and standing up from a chair with arms (e.g., wheelchair, bedside commode, etc,.)?: A Little Help needed moving to and from a bed to chair (including a wheelchair)?: A Little Help needed walking in hospital room?: A Little Help needed climbing 3-5 steps with a railing? : A Little 6 Click Score: 19    End of Session Equipment Utilized During Treatment: Gait belt;Cervical collar Activity Tolerance: Patient limited by lethargy Patient left: in bed;with family/visitor present;with call bell/phone within reach Nurse Communication: Mobility status PT Visit Diagnosis: Unsteadiness on feet (R26.81);Difficulty in walking, not elsewhere classified (R26.2)     Time: 1050-1107 PT Time Calculation (min) (ACUTE ONLY): 17 min  Charges:  $Gait Training: 8-22 mins                     G Codes:       Conni Slipper, PT, DPT Acute Rehabilitation Services Pager: [phone]    Marylynn Pearson 03/13/2017, 12:56 PM

## 2017-03-13 NOTE — Progress Notes (Signed)
Patient alert and oriented, mae's well, voiding adequate amount of urine, swallowing without difficulty, no c/o pain at time of discharge. Patient discharged home with family. Script and discharged instructions given to patient. Patient and family stated understanding of instructions given. Patient has an appointment with Dr. Ditty  

## 2017-03-25 DIAGNOSIS — Z5189 Encounter for other specified aftercare: Secondary | ICD-10-CM | POA: Insufficient documentation

## 2017-03-29 NOTE — Op Note (Signed)
03/11/2017 - 03/12/2017  8:00 PM  PATIENT:  Albert Hubbard  56 y.o. male  PRE-OPERATIVE DIAGNOSIS:  Cervical spondylosis with radiculopathy; bilateral neuroforaminal stenosis C3-T1  POST-OPERATIVE DIAGNOSIS:  Same  PROCEDURE:  C3-T1 laminectomies with posterior fixaton and fusion; use of BMP  SURGEON:  Hulan SaasBenjamin J. Aiana Nordquist, MD  ASSISTANTS: Tia Alertavid S. Jones, MD  ANESTHESIA:   General  DRAINS: Medium hemovac   SPECIMEN:  None  INDICATION FOR PROCEDURE: Albert CarrowWilliam Kogler returns for a planned return to the OR for the second part of a staged procedure to treat his diffuse cervical neuroforaminal stenosis.  Patient understood the risks, benefits, and alternatives and potential outcomes and wished to proceed.  PROCEDURE DETAILS: The patient was then placed in Mayfield pins and turned prone on the operating table. They were positioned in reverse Trendelenburg with knees flexed and head was fixated to the operative table.The patient was prepped and draped in the usual sterile fashion.   A midline incision was made from below the inion to approximately T1. A subperiosteal dissection was used to expose the spine from C3 to T1 with extreme caution taken to not expose the C2-3 or T1-2 facet joints. C3 was identified using fluoroscopy. Then lateral mass screws were placed in C3 to 7 bilaterally using anatomic landmarks. Wide laminectomies were performed from inferior C3 to the superior portion of T1.  Then at T1 bilateral pedicle screws were placed using anatomic landmarks and the ability to directly palpate the pedicle on each side.   The high speed drill was used to thin the facet joints bilaterally from C3 to T1.  I then used a small Kerrison rongeur to resect the facets at each level and decompress the nerve roots.   The nerves were under tremendous amounts of compression and there was barely room to fit the footplate of the Kerrison into the foramina at most levels.  The nerves were decompressed without  apparent injury.  A rod was placed within the tulip heads on each side and secured with set screw caps. I irrigated vigorously with bacitracin saline.  The lateral masses and facet joints at each level as well as the remaining lamina at T1 was decorticated with a high-speed drill. The wound was vigorously irrigated with antibiotic irrigation. BMP sponges were placed along the decorticated surfaces lateral to the hardware.  Fusion substrate was inserted along the decorticated area which consisted of locally harvested autograft as well as and morcellized allograft and hydroxyapatite.   The setscrews were finally tightened. Vancomycin powder was placed in the wound. The wound was closed in multiple layers with interrupted sutures. The skin was closed with a running subcuticular stratafix monocryl suture. Dermabond was used to close the skin. The drapes were then taken down patient was returned to the prone position in the Mayfield pins were removed.  He awoke without complication.  PATIENT DISPOSITION:  PACU - hemodynamically stable.   Delay start of Pharmacological VTE agent (>24hrs) due to surgical blood loss or risk of bleeding:  yes

## 2017-08-18 ENCOUNTER — Ambulatory Visit: Payer: Medicaid Other | Admitting: Physical Therapy

## 2017-08-24 ENCOUNTER — Ambulatory Visit: Payer: Medicaid Other | Admitting: Physical Therapy

## 2017-08-27 ENCOUNTER — Ambulatory Visit: Payer: Medicare Other | Attending: Physician Assistant | Admitting: Physical Therapy

## 2017-08-27 DIAGNOSIS — M542 Cervicalgia: Secondary | ICD-10-CM

## 2017-08-27 DIAGNOSIS — M4722 Other spondylosis with radiculopathy, cervical region: Secondary | ICD-10-CM | POA: Insufficient documentation

## 2017-08-27 DIAGNOSIS — M6281 Muscle weakness (generalized): Secondary | ICD-10-CM | POA: Diagnosis present

## 2017-08-27 NOTE — Therapy (Addendum)
[facility] [address] [place], [place], [address] Phone: [phone]   Fax:  [phone]  Physical Therapy Evaluation  Patient Details  Name: Albert Hubbard MRN: [id number] Date of Birth: Nov 09, 1960 Referring Provider: Alyson Inglesostella, Vincent J, PA-C   Encounter Date: 08/27/2017  PT End of Session - 08/27/17 1232    Visit Number  1    Number of Visits  4    Date for PT Re-Evaluation  10/02/17    Authorization Type  MCD    PT Start Time  1145    PT Stop Time  1232    PT Time Calculation (min)  47 min    Activity Tolerance  Patient tolerated treatment well    Behavior During Therapy  [facility] for tasks assessed/performed       Past Medical History:  Diagnosis Date  . Anxiety   . Arthritis   . Chronic back pain   . Depression    takes Sertaline daily  . Dyspnea    rarely but when does it's with exertion  . Hard of hearing   . Headache   . Peripheral neuropathy    takes Gabapentin daily  . Restless leg syndrome    takes Requip daily    Past Surgical History:  Procedure Laterality Date  . ANTERIOR CERVICAL DECOMP/DISCECTOMY FUSION N/A 03/11/2017   Procedure: Stage 1: C4-7 Anterior cervical discectomy with fusion and plate fixation;  Surgeon: Ditty, Loura HaltBenjamin Jared, MD;  [facility];  Service: Neurosurgery;  Laterality: N/A;  Stage 1: C4-7 Anterior cervical discectomy with fusion and plate fixation  . HERNIA REPAIR    . INNER EAR SURGERY    . MOUTH SURGERY    . POSTERIOR CERVICAL FUSION/FORAMINOTOMY N/A 03/12/2017   Procedure: Stage 2: Cervical three-Thoracic one Laminectiomies with foraminotomies, fixation and fusion;  Surgeon: Ditty, Loura HaltBenjamin Jared, MD;  [facility];  Service: Neurosurgery;  Laterality: N/A;  Stage 2: C3-T1 Laminectiomies with foraminotomies, fixation and fusion  . TONSILLECTOMY      There were no vitals filed for this visit.   Subjective Assessment - 08/27/17 1149    Subjective  Main problem is  standing for too long in one place or walking too much. Tries to walk around apt complex. Has difficulty getting out of chairs. Uses cane full time. Thinks his lifting restriction is 15lb.     How long can you stand comfortably?  15-20 min    How long can you walk comfortably?  10-15 min    Patient Stated Goals  improve endurance, be able to sit unsupported    Currently in Pain?  Yes    Pain Score  7     Pain Location  Neck    Pain Orientation  Lower    Pain Descriptors / Indicators  Burning    Pain Radiating Towards  across bilat shoulders    Aggravating Factors   sitting unsupported, walking, standing    Pain Relieving Factors  lay down or lay in recliner         [facility] PT Assessment - 08/27/17 0001      Assessment   Medical Diagnosis  cervical spondylosis with radiculopathy    Referring Provider  Alyson Inglesostella, Vincent J, PA-C    Onset Date/Surgical Date  03/12/17    Hand Dominance  Right    Next MD Visit  Jan 2019    Prior Therapy  no      Precautions   Precautions  Cervical  Restrictions   Weight Bearing Restrictions  No      Balance Screen   Has the patient fallen in the past 6 months  No      Home Environment   Living Environment  Private residence    Additional Comments  apartment      Prior Function   Level of Independence  Independent    Vocation  On disability      Cognition   Overall Cognitive Status  Within Functional Limits for tasks assessed      Sensation   Additional Comments  Occasional N/T in fingers      Posture/Postural Control   Posture Comments  forward head, rounded shoulders      ROM / Strength   AROM / PROM / Strength  AROM;Strength      AROM   Overall AROM Comments  GHJ AROM WFL      Strength   Strength Assessment Site  Shoulder    Right/Left Shoulder  Right;Left    Right Shoulder Flexion  4-/5    Right Shoulder ABduction  4-/5    Right Shoulder External Rotation  3/5    Left Shoulder Flexion  4-/5    Left Shoulder ABduction   4-/5    Left Shoulder External Rotation  3/5             Objective measurements completed on examination: See above findings.      [facility] Adult PT Treatment/Exercise - 08/27/17 0001      Exercises   Exercises  Shoulder      Shoulder Exercises: Seated   Retraction Limitations  scapular retraction    Theraband Level (Shoulder External Rotation)  Level 1 (Yellow)    Flexion Limitations  AROM without weight      Shoulder Exercises: Standing   Theraband Level (Shoulder Row)  Level 2 (Red)    Other Standing Exercises  wall towel slides      Shoulder Exercises: Stretch   Other Shoulder Stretches  door pec stretch             PT Education - 08/27/17 1240    Education provided  Yes    Education Details  anatomy of condition, POC, HEP, exercise form/rationale    Person(s) Educated  Patient    Methods  Explanation;Demonstration;Tactile cues;Verbal cues;Handout    Comprehension  Verbalized understanding;Need further instruction;Returned demonstration;Verbal cues required;Tactile cues required       PT Short Term Goals - 08/27/17 1251      PT SHORT TERM GOAL #1   Title  Pt will demo gross GHJ MMT strength 4+/5 for improved stability to biomechanical chain    Baseline  see flowsheet    Time  5    Period  Weeks    Status  New    Target Date  10/02/17      PT SHORT TERM GOAL #2   Title  Pt will verbalize ability to utilize scapular retraction for proper postural alignment throughout the day    Baseline  began educating at eval    Time  5    Period  Weeks    Status  New    Target Date  10/02/17        PT Long Term Goals - 08/27/17 1253      PT LONG TERM GOAL #1   Title  Pt will be able to lift/carry objects such as groceries without feeling limited by neck pain    Baseline  is very careful  at eval    Time  8    Period  Weeks    Status  New    Target Date  10/23/17      PT LONG TERM GOAL #2   Title  pt will be able to stand to walk for at least 45 min  without limitaiton by neck pain for improved community ambulation    Baseline  10-15 min at eval    Time  8    Period  Weeks    Status  New    Target Date  10/23/17      PT LONG TERM GOAL #3   Title  Pt will be able to sit comfortably for at least 20 min without support to head and neck to demo improved endurance    Baseline  unable to sit comfortably without support to head/neck at eval    Time  8    Period  Weeks    Status  New    Target Date  10/23/17       GCODES: progress toward LTGs Current CL Goal CJ      Plan - 08/27/17 1240    Clinical Impression Statement  Pt presents to PT with complaints of neck and bilateral shoulder pain s/p C4-7 fusion. Pt presents with good ROM in cervical region and GHJ ROM WFL but significant weakness with poor endurance. Heavy education on importance of postural alignment and building endurance as well as discussion of types of pain/discomfort that he will feel. pt will benefit from skilled PT in order to improve postural and periscapular endurance and meet long term goals. Pt verbalized poor balance due to dysequilibruim secondary to hearing impairment, ambulates with cane for safety.     History and Personal Factors relevant to plan of care:  h/o cervical fusion, anxiety, depression, peripheral neuropathy    Clinical Presentation  Stable    Clinical Decision Making  Low    Rehab Potential  Good    PT Frequency  1x / week    PT Duration  3 weeks    PT Treatment/Interventions  ADLs/Self Care Home Management;Cryotherapy;Electrical Stimulation;Ultrasound;Moist Heat;Iontophoresis 4mg /ml Dexamethasone;Functional mobility training;Therapeutic activities;Therapeutic exercise;Balance training;Patient/family education;Neuromuscular re-education;Manual techniques;Passive range of motion;Taping;Dry needling    PT Next Visit Plan  gross periscapular strengthening & postural endurance    PT Home Exercise Plan  scapular retraction, GHJ flexion, ER yellow  tband, rows, door pec stretch, towel wall slide    Consulted and Agree with Plan of Care  Patient       Patient will benefit from skilled therapeutic intervention in order to improve the following deficits and impairments:  Improper body mechanics, Pain, Postural dysfunction, Increased muscle spasms, Decreased activity tolerance, Decreased endurance, Decreased strength, Impaired UE functional use, Decreased balance  Visit Diagnosis: Cervical spondylosis with radiculopathy - Plan: PT plan of care cert/re-cert  Cervicalgia - Plan: PT plan of care cert/re-cert  Muscle weakness (generalized) - Plan: PT plan of care cert/re-cert     Problem List Patient Active Problem List   Diagnosis Date Noted  . Cervical spondylosis with radiculopathy 03/11/2017    Zuriel Yeaman C. Keltie Labell PT, DPT 08/27/17 1:00 PM   [facility] [address] [place], [place], [address] Phone: [phone]   Fax:  [phone]  Name: Albert Hubbard MRN: [id number] Date of Birth: 02-21-1961

## 2017-09-09 ENCOUNTER — Telehealth: Payer: Self-pay | Admitting: Physical Therapy

## 2017-09-09 ENCOUNTER — Ambulatory Visit: Payer: Medicare Other | Admitting: Physical Therapy

## 2017-09-09 NOTE — Telephone Encounter (Signed)
Spoke with pt daughter, unsure why he missed his appointment. Advised of next appointment time and requested that he contact us if he is unable to attend.  Tyyonna Soucy C. Jahlon Baines PT, DPT 09/09/17 3:26 PM

## 2017-09-16 ENCOUNTER — Ambulatory Visit: Payer: Medicare Other | Attending: Physician Assistant | Admitting: Physical Therapy

## 2017-09-16 ENCOUNTER — Encounter: Payer: Self-pay | Admitting: Physical Therapy

## 2017-09-16 DIAGNOSIS — M6281 Muscle weakness (generalized): Secondary | ICD-10-CM | POA: Diagnosis present

## 2017-09-16 DIAGNOSIS — M542 Cervicalgia: Secondary | ICD-10-CM

## 2017-09-16 NOTE — Therapy (Signed)
[facility] [address] [place], [place], [address] Phone: [phone]   Fax:  [phone]  Physical Therapy Treatment  Patient Details  Name: Albert Hubbard MRN: [id number] Date of Birth: 1960/12/10 Referring Provider: Alyson Ingles, PA-C   Encounter Date: 09/16/2017  PT End of Session - 09/16/17 1329    Visit Number  2    Number of Visits  4    Date for PT Re-Evaluation  09/29/17    Authorization Type  MCD 3 visits 11/28-12/18    PT Start Time  1330    PT Stop Time  1413    PT Time Calculation (min)  43 min    Activity Tolerance  Patient tolerated treatment well    Behavior During Therapy  [facility] for tasks assessed/performed       Past Medical History:  Diagnosis Date  . Anxiety   . Arthritis   . Chronic back pain   . Depression    takes Sertaline daily  . Dyspnea    rarely but when does it's with exertion  . Hard of hearing   . Headache   . Peripheral neuropathy    takes Gabapentin daily  . Restless leg syndrome    takes Requip daily    Past Surgical History:  Procedure Laterality Date  . ANTERIOR CERVICAL DECOMP/DISCECTOMY FUSION N/A 03/11/2017   Procedure: Stage 1: C4-7 Anterior cervical discectomy with fusion and plate fixation;  Surgeon: Ditty, Loura Halt, MD;  [facility];  Service: Neurosurgery;  Laterality: N/A;  Stage 1: C4-7 Anterior cervical discectomy with fusion and plate fixation  . HERNIA REPAIR    . INNER EAR SURGERY    . MOUTH SURGERY    . POSTERIOR CERVICAL FUSION/FORAMINOTOMY N/A 03/12/2017   Procedure: Stage 2: Cervical three-Thoracic one Laminectiomies with foraminotomies, fixation and fusion;  Surgeon: Ditty, Loura Halt, MD;  Location: [facility] OR;  Service: Neurosurgery;  Laterality: N/A;  Stage 2: C3-T1 Laminectiomies with foraminotomies, fixation and fusion  . TONSILLECTOMY      There were no vitals filed for this visit.  Subjective Assessment - 09/16/17 1330    Subjective   Has been doing exercises but feels limited in towel slides on wall, after a few repetitions feels tension pain across shoulders. Has been focusing on posture. Weather is making neck feel poor today-cold. Posture alignment feels better but he gets tired and notices poor endurance.     Patient Stated Goals  improve endurance, be able to sit unsupported    Currently in Pain?  Yes    Pain Score  7     Pain Location  Neck                      [facility] Adult PT Treatment/Exercise - 09/16/17 0001      Shoulder Exercises: Seated   Extension  Both;15 reps 5 each with yellow tband    Flexion Limitations  seated flexion with wand towel roll along spine    Other Seated Exercises  towel alignment for posture      Shoulder Exercises: Prone   Retraction  15 reps    Extension  15 reps      Shoulder Exercises: ROM/Strengthening   UBE (Upper Arm Bike)  retro 4 min L1.5      Shoulder Exercises: Stretch   Table Stretch - Flexion  5 reps               PT Short Term Goals -  08/27/17 1251      PT SHORT TERM GOAL #1   Title  Pt will demo gross GHJ MMT strength 4+/5 for improved stability to biomechanical chain    Baseline  see flowsheet    Time  5    Period  Weeks    Status  New    Target Date  10/02/17      PT SHORT TERM GOAL #2   Title  Pt will verbalize ability to utilize scapular retraction for proper postural alignment throughout the day    Baseline  began educating at eval    Time  5    Period  Weeks    Status  New    Target Date  10/02/17        PT Long Term Goals - 08/27/17 1253      PT LONG TERM GOAL #1   Title  Pt will be able to lift/carry objects such as groceries without feeling limited by neck pain    Baseline  is very careful at eval    Time  8    Period  Weeks    Status  New    Target Date  10/23/17      PT LONG TERM GOAL #2   Title  pt will be able to stand to walk for at least 45 min without limitaiton by neck pain for improved community  ambulation    Baseline  10-15 min at eval    Time  8    Period  Weeks    Status  New    Target Date  10/23/17      PT LONG TERM GOAL #3   Title  Pt will be able to sit comfortably for at least 20 min without support to head and neck to demo improved endurance    Baseline  unable to sit comfortably without support to head/neck at eval    Time  8    Period  Weeks    Status  New    Target Date  10/23/17            Plan - 09/16/17 1336    Clinical Impression Statement  Pt fatigued with exercises but denied increase in pain, felt the soreness expected with exercise. Sat in chair with rolled towel along spine for stability. Pt is going to get a pool noodle to use at home. Will require ERO for mcd extension at next visit.     PT Treatment/Interventions  ADLs/Self Care Home Management;Cryotherapy;Electrical Stimulation;Ultrasound;Moist Heat;Iontophoresis 4mg /ml Dexamethasone;Functional mobility training;Therapeutic activities;Therapeutic exercise;Balance training;Patient/family education;Neuromuscular re-education;Manual techniques;Passive range of motion;Taping;Dry needling    PT Next Visit Plan  ERO, gross periscapular strengthening & postural endurance    PT Home Exercise Plan  scapular retraction, GHJ flexion, ER yellow tband, rows, door pec stretch, towel wall slide; table slide, seated horiz abd, seated flexion with wand, prone retraction & extension    Consulted and Agree with Plan of Care  Patient       Patient will benefit from skilled therapeutic intervention in order to improve the following deficits and impairments:  Improper body mechanics, Pain, Postural dysfunction, Increased muscle spasms, Decreased activity tolerance, Decreased endurance, Decreased strength, Impaired UE functional use, Decreased balance  Visit Diagnosis: Cervicalgia  Muscle weakness (generalized)     Problem List Patient Active Problem List   Diagnosis Date Noted  . Cervical spondylosis with  radiculopathy 03/11/2017   Albert Hubbard C. Sally-Anne Wamble PT, DPT 09/16/17 2:16 PM   [facility] [address] [place], [place], [address] Phone: [phone]   Fax:  [phone]  Name: Albert CarrowWilliam Hubbard MRN: [id number] Date of Birth: 1961/03/08

## 2017-09-23 ENCOUNTER — Encounter: Payer: Self-pay | Admitting: Physical Therapy

## 2017-09-23 ENCOUNTER — Ambulatory Visit: Payer: Medicare Other | Admitting: Physical Therapy

## 2017-09-23 DIAGNOSIS — M542 Cervicalgia: Secondary | ICD-10-CM

## 2017-09-23 DIAGNOSIS — M6281 Muscle weakness (generalized): Secondary | ICD-10-CM
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
## 2017-10-08 ENCOUNTER — Encounter: Payer: Medicaid Other | Admitting: Physical Therapy

## 2017-10-14 ENCOUNTER — Ambulatory Visit: Payer: Medicare Other | Attending: Physician Assistant | Admitting: Physical Therapy

## 2017-10-14 ENCOUNTER — Telehealth: Payer: Self-pay | Admitting: Physical Therapy

## 2017-10-14 NOTE — Telephone Encounter (Signed)
Left message on patient's daughter's voicemail  (  As per info in chart last PT note,  We are to call daughter for transportation issues).  Patient missed visit today.  Informed her of his appointment with us tomorrow.  Number left for her to call  If she needs to cancel.   Liz BeachKaren Kobyn Kray PTA

## 2017-10-15 ENCOUNTER — Ambulatory Visit: Payer: Medicare Other | Admitting: Physical Therapy

## 2017-10-20 ENCOUNTER — Ambulatory Visit: Payer: Medicare Other | Admitting: Physical Therapy

## 2017-10-23 ENCOUNTER — Telehealth: Payer: Self-pay | Admitting: Physical Therapy

## 2017-10-23 NOTE — Telephone Encounter (Signed)
Left message. Has NS for last 3 visits and MCD auth ends on 1/15. Episode to be d/c on the 15th. Please contact us with any questions. Courtlynn Holloman C. Allante Whitmire PT, DPT 10/23/17 11:07 AM

## 2018-10-05 IMAGING — RF DG C-ARM 61-120 MIN
1 series · 2 of 2 positions shown · non-contrast
Comparison: Multiple priors.

CLINICAL DATA: Neck pain.

EXAM:
CERVICAL SPINE - 2-3 VIEW; DG C-ARM 61-120 MIN

[Series 1: run · 2 of 2 slices shown]
[im 1/2]
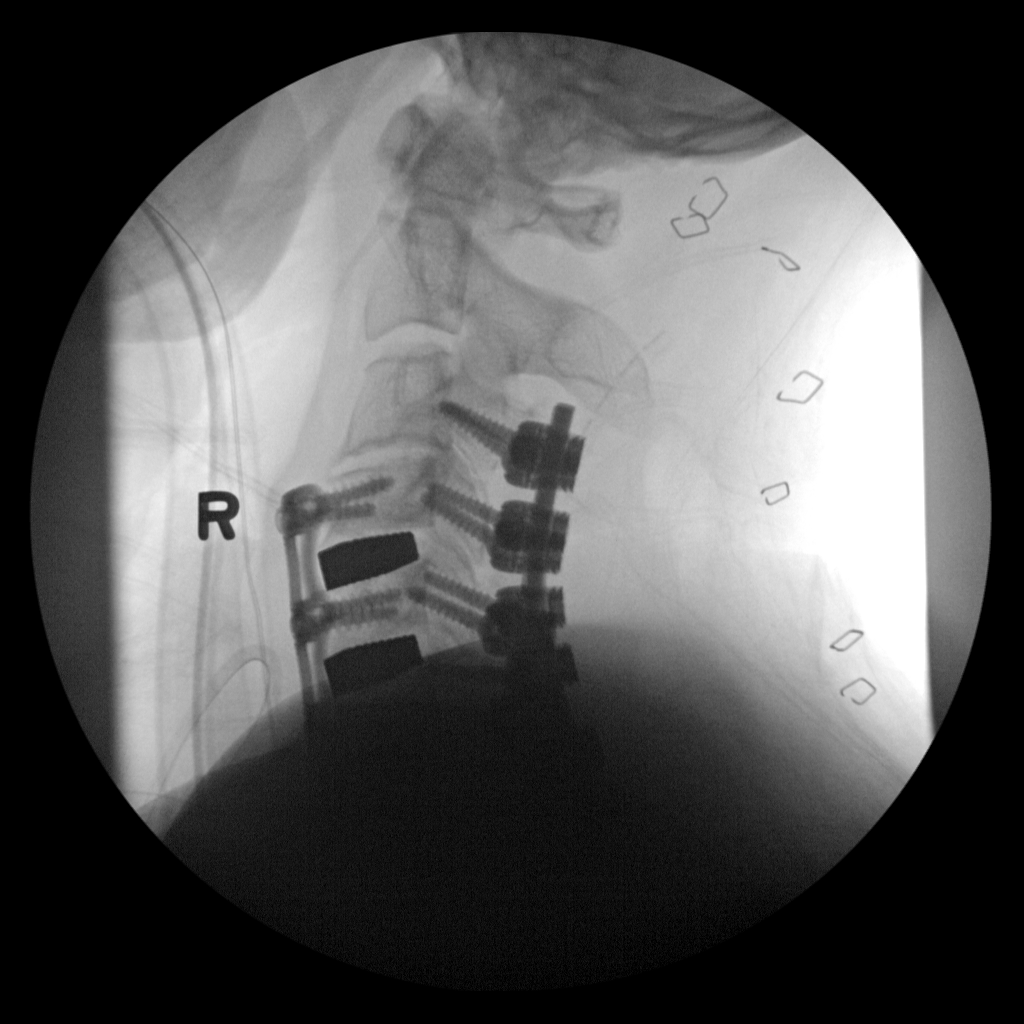
[im 2/2]
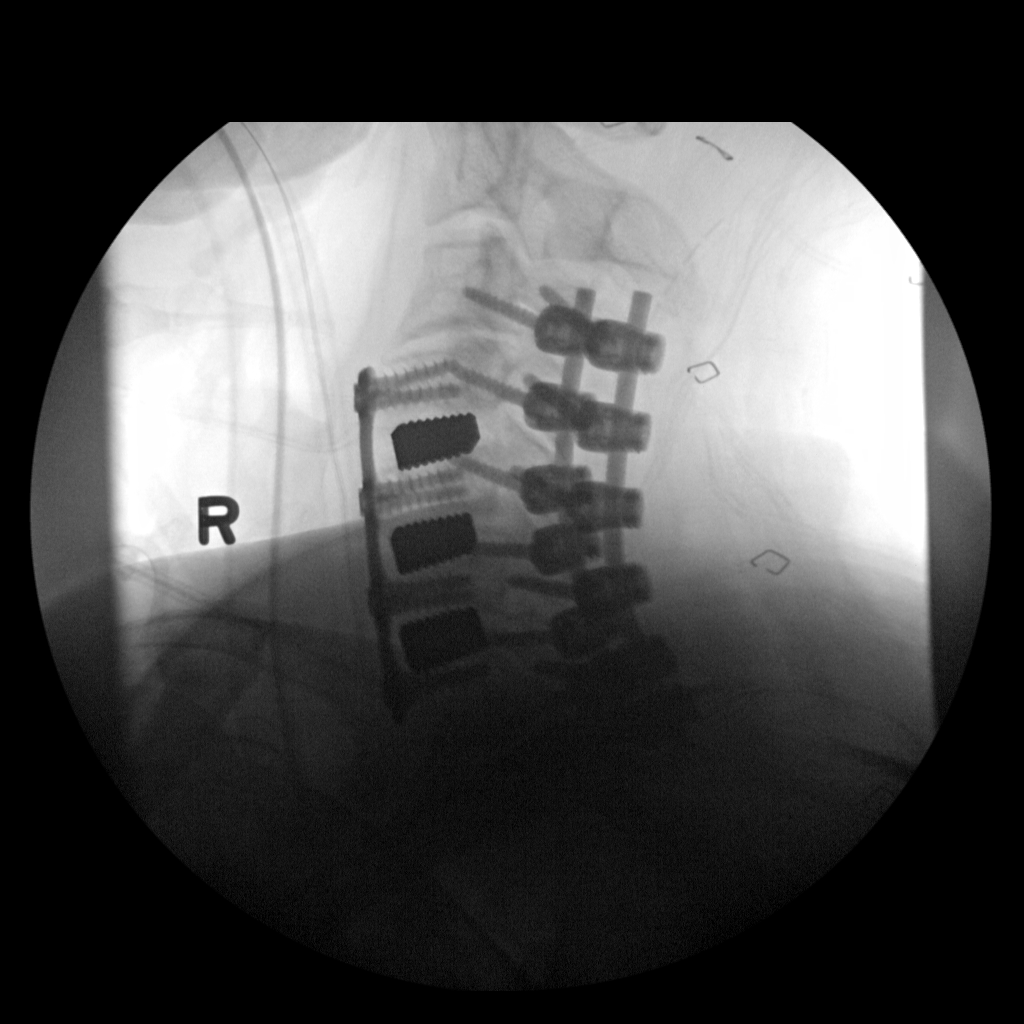

[2 of 2 positions shown; findings below may reference images not displayed]

FINDINGS: C-arm films document C3-C7 posterior fusion using screw and rod
fixation. C4-C7 ACDF performed yesterday.
IMPRESSION: Grossly unremarkable C3-C7 fusion construct.

## 2019-05-16 DIAGNOSIS — Z9889 Other specified postprocedural states: Secondary | ICD-10-CM | POA: Insufficient documentation

## 2019-05-16 DIAGNOSIS — M549 Dorsalgia, unspecified: Secondary | ICD-10-CM | POA: Insufficient documentation

## 2019-05-19 DIAGNOSIS — M7540 Impingement syndrome of unspecified shoulder: Secondary | ICD-10-CM | POA: Insufficient documentation

## 2019-06-06 DIAGNOSIS — R29898 Other symptoms and signs involving the musculoskeletal system: Secondary | ICD-10-CM | POA: Insufficient documentation

## 2019-06-06 DIAGNOSIS — M79602 Pain in left arm: Secondary | ICD-10-CM | POA: Insufficient documentation

## 2020-05-29 DIAGNOSIS — R079 Chest pain, unspecified: Secondary | ICD-10-CM | POA: Diagnosis not present

## 2020-07-30 ENCOUNTER — Other Ambulatory Visit: Payer: Self-pay | Admitting: *Deleted

## 2020-07-30 DIAGNOSIS — M79605 Pain in left leg: Secondary | ICD-10-CM

## 2020-08-13 ENCOUNTER — Inpatient Hospital Stay (HOSPITAL_COMMUNITY): Admission: RE | Admit: 2020-08-13 | Payer: Medicare Other | Source: Ambulatory Visit

## 2020-09-14 ENCOUNTER — Ambulatory Visit (HOSPITAL_COMMUNITY)
Admission: RE | Admit: 2020-09-14 | Discharge: 2020-09-14 | Disposition: A | Discharge status: Home / Self Care | Payer: Medicare Other | Source: Ambulatory Visit | Attending: Physician Assistant | Admitting: Physician Assistant

## 2020-09-14 ENCOUNTER — Other Ambulatory Visit: Payer: Self-pay

## 2020-09-14 ENCOUNTER — Ambulatory Visit (INDEPENDENT_AMBULATORY_CARE_PROVIDER_SITE_OTHER): Payer: Medicare Other | Admitting: Physician Assistant

## 2020-09-14 VITALS — BP 106/76 | HR 73 | Temp 98.6°F | Resp 20 | Ht 68.0 in | Wt 189.0 lb

## 2020-09-14 DIAGNOSIS — I872 Venous insufficiency (chronic) (peripheral): Secondary | ICD-10-CM | POA: Diagnosis not present

## 2020-09-14 DIAGNOSIS — M7989 Other specified soft tissue disorders: Secondary | ICD-10-CM

## 2020-09-14 DIAGNOSIS — M79605 Pain in left leg: Secondary | ICD-10-CM | POA: Diagnosis not present

## 2020-09-14 NOTE — Progress Notes (Signed)
Requested by:  Alinda Deem, MD [address] [place],  [place] [address]  Reason for consultation: pain in left leg with trace edema   History of Present Illness   Albert Hubbard is a 59 y.o. (10/02/1961) male who presents for evaluation of 6 months to 1 year of left lower extremity swelling. He denies any injury, fall or change in activity. He says the swelling just came on all the sudden and it stays the same no matter what. He says he elevates his leg in a recliner and tries to on pillows while he sleeps and he notices no change in swelling. He has never worn compression stockings. He has history of Restless leg syndrome with symptoms in left leg worse than right. This is very bothersome to him as it happens both during day and at night. He additionally has shooting pains in left toes, ankle and up left shin. This is not constant. He takes Requip and Gabapentin with no improvement. He says he overall is very active. He does not work currently due to cervical spine fusion and previous other back surgeries. He says he use to work multiple various jobs requiring heavy lifting and prolonged standing. He  denies any claudication symptoms, rest pain or non healing wounds. He has no family or personal history of DVT. No family history of venous disease.   Venous symptoms include: swelling Onset/duration: >6 months Occupation: disabled  Aggravating factors: (sitting, standing) Alleviating factors: (elevation) Compression:  No Helps: has not worn before Pain medications:  Takes hydrocodone for chronic back pain Previous vein procedures: none History of DVT:  no  Past Medical History:  Diagnosis Date  . Anxiety   . Arthritis   . Chronic back pain   . Depression    takes Sertaline daily  . Dyspnea    rarely but when does it's with exertion  . Hard of hearing   . Headache   . Peripheral neuropathy    takes Gabapentin daily  . Restless leg syndrome    takes Requip daily      Past Surgical History:  Procedure Laterality Date  . ANTERIOR CERVICAL DECOMP/DISCECTOMY FUSION N/A 03/11/2017   Procedure: Stage 1: C4-7 Anterior cervical discectomy with fusion and plate fixation;  Surgeon: Ditty, Loura Halt, MD;  Location: [facility];  Service: Neurosurgery;  Laterality: N/A;  Stage 1: C4-7 Anterior cervical discectomy with fusion and plate fixation  . HERNIA REPAIR    . INNER EAR SURGERY    . MOUTH SURGERY    . POSTERIOR CERVICAL FUSION/FORAMINOTOMY N/A 03/12/2017   Procedure: Stage 2: Cervical three-Thoracic one Laminectiomies with foraminotomies, fixation and fusion;  Surgeon: Ditty, Loura Halt, MD;  Location: [facility] OR;  Service: Neurosurgery;  Laterality: N/A;  Stage 2: C3-T1 Laminectiomies with foraminotomies, fixation and fusion  . TONSILLECTOMY      Social History   Socioeconomic History  . Marital status: Legally Separated    Spouse name: Not on file  . Number of children: Not on file  . Years of education: Not on file  . Highest education level: Not on file  Occupational History  . Not on file  Tobacco Use  . Smoking status: Former Games developer  . Smokeless tobacco: Never Used  . Tobacco comment: stopped smoking over a yr ago  Vaping Use  . Vaping Use: Never used  Substance and Sexual Activity  . Alcohol use: Yes    Comment: 6 pks a week  . Drug use: No  .  Sexual activity: Not on file  Other Topics Concern  . Not on file  Social History Narrative  . Not on file   Social Determinants of Health   Financial Resource Strain:   . Difficulty of Paying Living Expenses: Not on file  Food Insecurity:   . Worried About Programme researcher, broadcasting/film/video in the Last Year: Not on file  . Ran Out of Food in the Last Year: Not on file  Transportation Needs:   . Lack of Transportation (Medical): Not on file  . Lack of Transportation (Non-Medical): Not on file  Physical Activity:   . Days of Exercise per Week: Not on file  . Minutes of Exercise per Session: Not on file   Stress:   . Feeling of Stress : Not on file  Social Connections:   . Frequency of Communication with Friends and Family: Not on file  . Frequency of Social Gatherings with Friends and Family: Not on file  . Attends Religious Services: Not on file  . Active Member of Clubs or Organizations: Not on file  . Attends Banker Meetings: Not on file  . Marital Status: Not on file  Intimate Partner Violence:   . Fear of Current or Ex-Partner: Not on file  . Emotionally Abused: Not on file  . Physically Abused: Not on file  . Sexually Abused: Not on file   History reviewed. No pertinent family history.  Current Outpatient Medications  Medication Sig Dispense Refill  . albuterol (VENTOLIN HFA) 108 (90 Base) MCG/ACT inhaler Inhale into the lungs.    Marland Kitchen atorvastatin (LIPITOR) 40 MG tablet Take 40 mg by mouth at bedtime.    . diclofenac (VOLTAREN) 75 MG EC tablet Take 75 mg by mouth every evening.   2  . escitalopram (LEXAPRO) 10 MG tablet Take 10 mg by mouth daily.    Marland Kitchen gabapentin (NEURONTIN) 300 MG capsule Take 1 capsule (300 mg total) by mouth 3 (three) times daily. 90 capsule 2  . HYDROcodone-acetaminophen (NORCO) 7.5-325 MG tablet Take 1-2 tablets by mouth every 4 (four) hours as needed for moderate pain. 60 tablet 0  . methocarbamol (ROBAXIN) 750 MG tablet Take 1 tablet (750 mg total) by mouth every 6 (six) hours as needed for muscle spasms. 120 tablet 2  . nicotine (NICODERM CQ - DOSED IN MG/24 HOURS) 14 mg/24hr patch 14 mg daily.    . Pseudoephedrine HCl (SUPHEDRIN PO) Take 1 tablet by mouth daily as needed (allergies).    Marland Kitchen rOPINIRole (REQUIP) 0.25 MG tablet Take 0.25 mg by mouth daily as needed.    . rotigotine (NEUPRO) 4 MG/24HR Place 1 patch onto the skin daily.    . sertraline (ZOLOFT) 100 MG tablet Take 50 mg by mouth at bedtime.   0   No current facility-administered medications for this visit.    Allergies  Allergen Reactions  . Cortisone Other (See Comments)     Unknown Patient thinks he has had this since with no issues    REVIEW OF SYSTEMS (negative unless checked):   Cardiac:  []  Chest pain or chest pressure? []  Shortness of breath upon activity? []  Shortness of breath when lying flat? []  Irregular heart rhythm?  Vascular:  []  Pain in calf, thigh, or hip brought on by walking? []  Pain in feet at night that wakes you up from your sleep? []  Blood clot in your veins? []  Leg swelling?  Pulmonary:  []  Oxygen at home? []  Productive cough? []  Wheezing?  Neurologic:  []   Sudden weakness in arms or legs? []  Sudden numbness in arms or legs? []  Sudden onset of difficult speaking or slurred speech? []  Temporary loss of vision in one eye? []  Problems with dizziness?  Gastrointestinal:  []  Blood in stool? []  Vomited blood?  Genitourinary:  []  Burning when urinating? []  Blood in urine?  Psychiatric:  []  Major depression  Hematologic:  []  Bleeding problems? []  Problems with blood clotting?  Dermatologic:  []  Rashes or ulcers?  Constitutional:  []  Fever or chills?  Ear/Nose/Throat:  []  Change in hearing? []  Nose bleeds? []  Sore throat?  Musculoskeletal:  []  Back pain? []  Joint pain? []  Muscle pain?   Physical Examination     Vitals:   09/14/20 1305  BP: 106/76  Pulse: 73  Resp: 20  Temp: 98.6 F (37 C)  TempSrc: Temporal  SpO2: 95%  Weight: 189 lb (85.7 kg)  Height: 5\' 8"  (1.727 m)   Body mass index is 28.74 kg/m.  General:  WDWN in NAD; vital signs documented above Gait: Normal HENT: WNL, normocephalic Pulmonary: normal non-labored breathing , without Rales, rhonchi,  wheezing Cardiac: regular HR, without  Murmurs without carotid bruit Abdomen: soft, NT, no masses Vascular Exam/Pulses:  Right Left  Radial 2+ (normal) 2+ (normal)  Ulnar 2+ (normal) 2+ (normal)  Femoral 2+ (normal) 2+ (normal)  Popliteal 2+ (normal) 2+ (normal)  DP 2+ (normal) 2+ (normal)  PT 2+ (normal) 2+ (normal)    Extremities: with varicose veins of mid anterior left leg , without reticular veins, without edema, without stasis pigmentation, without lipodermatosclerosis, without ulcers Musculoskeletal: no muscle wasting or atrophy  Neurologic: A&O X 3;  No focal weakness or paresthesias are detected Psychiatric:  The pt has Normal affect.  Non-invasive Vascular Imaging   BLE Venous Insufficiency Duplex (09/14/20):   LLE:  No DVT and SVT   GSV reflux from mid calf to SFJ   GSV diameter 0.52-0.63  No SSV reflux   CFV deep venous reflux   Medical Decision Making    Albert Hubbard is a 59 y.o. male who presents with: LLE chronic venous insufficiency with swelling and pain. His duplex today shows deep and superficial venous reflux of the left lower extremities. His GSV is of adequate size to be candidate for an ablation.   Based on the patient's history and examination, I recommend conservative therapy with elevation, compression stockings, and exercise  I discussed with the patient the use of her 20-30 mm thigh high compression stockings and need for 3 month trial of such.  The patient will follow up in 3 months with Dr. or Dr.  Thank you for allowing to participate in this patient's care.   , PA-C [facility] Office: [phone]  09/14/2020, 1:23 PM  Clinic MD: Dr. 

## 2020-12-13 ENCOUNTER — Ambulatory Visit: Payer: Medicare Other | Admitting: Vascular Surgery

## 2024-02-05 DIAGNOSIS — M4690 Unspecified inflammatory spondylopathy, site unspecified: Secondary | ICD-10-CM | POA: Insufficient documentation

## 2024-02-08 ENCOUNTER — Ambulatory Visit: Attending: Cardiology | Admitting: Cardiology

## 2024-02-08 ENCOUNTER — Ambulatory Visit: Attending: Cardiology

## 2024-02-08 ENCOUNTER — Encounter: Payer: Self-pay | Admitting: Cardiology

## 2024-02-08 VITALS — BP 122/86 | HR 79 | Ht 69.0 in | Wt 204.8 lb

## 2024-02-08 DIAGNOSIS — R0609 Other forms of dyspnea: Secondary | ICD-10-CM | POA: Diagnosis not present

## 2024-02-08 DIAGNOSIS — Z87891 Personal history of nicotine dependence: Secondary | ICD-10-CM

## 2024-02-08 DIAGNOSIS — I499 Cardiac arrhythmia, unspecified: Secondary | ICD-10-CM

## 2024-02-08 DIAGNOSIS — I493 Ventricular premature depolarization: Secondary | ICD-10-CM

## 2024-02-08 DIAGNOSIS — E785 Hyperlipidemia, unspecified: Secondary | ICD-10-CM | POA: Diagnosis not present

## 2024-02-08 NOTE — Progress Notes (Signed)
 Cardiology Consultation:    Date:  02/08/2024   ID:  Albert Hubbard, DOB 04-25-1961, MRN [id number]  PCP:  Marce Sensing, NP  Cardiologist:  Ralene Burger, MD   Referring MD: Marce Sensing, NP   Chief Complaint  Patient presents with   Heart Eval    History of Present Illness:    Albert Hubbard is a 63 y.o. male who is being seen today for the evaluation of PVCs at the request of Marce Sensing, NP.  Past medical history significant for anxiety, chronic back problem, depression, dyslipidemia, restless leg syndrome he was referred to us  because routine EKG done in the office showed some PVCs.  He did have trigeminy.  He does not feel any palpitations there is no dizziness no passing out.  Overall he says he is doing quite well.  He is trying to be active he walks every single day around his building apartment building that this.  Difficulty is chronic neck pain he did have some fusion done there when he walks for longer distance it started bothering him.  Denies have any chest pain tightness squeezing pressure burning chest.  He quit smoking 2 to 3 years ago he does not remember specific diet, he does not crave for cigarettes anymore.  He has been smoking for many years.  He does have family history of significant coronary disease in multiple members, some premature.  He is not on any special diet.  He never seen cardiologist and never have any heart evaluation done before  Past Medical History:  Diagnosis Date   Anxiety    Arthritis    Chronic back pain    Depression    takes Sertaline daily   Dyspnea    rarely but when does it's with exertion   Hard of hearing    Headache    Peripheral neuropathy    takes Gabapentin  daily   Restless leg syndrome    takes Requip  daily    Past Surgical History:  Procedure Laterality Date   ANTERIOR CERVICAL DECOMP/DISCECTOMY FUSION N/A 03/11/2017   Procedure: Stage 1: C4-7 Anterior cervical discectomy with fusion and plate fixation;   Surgeon: Ditty, Raelene Bullocks, MD;  Location: [facility] OR;  Service: Neurosurgery;  Laterality: N/A;  Stage 1: C4-7 Anterior cervical discectomy with fusion and plate fixation   HERNIA REPAIR     INNER EAR SURGERY     MOUTH SURGERY     POSTERIOR CERVICAL FUSION/FORAMINOTOMY N/A 03/12/2017   Procedure: Stage 2: Cervical three-Thoracic one Laminectiomies with foraminotomies, fixation and fusion;  Surgeon: Ditty, Raelene Bullocks, MD;  Location: [facility] OR;  Service: Neurosurgery;  Laterality: N/A;  Stage 2: C3-T1 Laminectiomies with foraminotomies, fixation and fusion   TONSILLECTOMY      Current Medications: Current Meds  Medication Sig   albuterol (VENTOLIN HFA) 108 (90 Base) MCG/ACT inhaler Inhale 2 puffs into the lungs every 6 (six) hours as needed for wheezing or shortness of breath.   atorvastatin (LIPITOR) 40 MG tablet Take 40 mg by mouth at bedtime.   diclofenac (VOLTAREN) 75 MG EC tablet Take 75 mg by mouth every evening.    escitalopram (LEXAPRO) 10 MG tablet Take 10 mg by mouth daily.   Pseudoephedrine HCl (SUPHEDRIN PO) Take 1 tablet by mouth daily as needed (allergies).   rOPINIRole  (REQUIP ) 0.25 MG tablet Take 0.25 mg by mouth daily as needed (restless).   [DISCONTINUED] gabapentin  (NEURONTIN ) 300 MG capsule Take 1 capsule (300 mg total) by mouth 3 (three)  times daily.   [DISCONTINUED] HYDROcodone -acetaminophen  (NORCO) 7.5-325 MG tablet Take 1-2 tablets by mouth every 4 (four) hours as needed for moderate pain.   [DISCONTINUED] methocarbamol  (ROBAXIN ) 750 MG tablet Take 1 tablet (750 mg total) by mouth every 6 (six) hours as needed for muscle spasms.   [DISCONTINUED] nicotine (NICODERM CQ - DOSED IN MG/24 HOURS) 14 mg/24hr patch Place 14 mg onto the skin daily.   [DISCONTINUED] rotigotine  (NEUPRO ) 4 MG/24HR Place 1 patch onto the skin daily.   [DISCONTINUED] sertraline  (ZOLOFT ) 100 MG tablet Take 50 mg by mouth at bedtime.      Allergies:   Cortisone   Social History   Socioeconomic  History   Marital status: Legally Separated    Spouse name: Not on file   Number of children: Not on file   Years of education: Not on file   Highest education level: Not on file  Occupational History   Not on file  Tobacco Use   Smoking status: Former   Smokeless tobacco: Never   Tobacco comments:    stopped smoking over a yr ago  Vaping Use   Vaping status: Never Used  Substance and Sexual Activity   Alcohol use: Yes    Comment: 6 pks a week   Drug use: No   Sexual activity: Not on file  Other Topics Concern   Not on file  Social History Narrative   Not on file   Social Drivers of Health   Financial Resource Strain: Not on file  Food Insecurity: Not on file  Transportation Needs: Not on file  Physical Activity: Not on file  Stress: Not on file  Social Connections: Not on file     Family History: The patient's family history is not on file. ROS:   Please see the history of present illness.    All 14 point review of systems negative except as described per history of present illness.  EKGs/Labs/Other Studies Reviewed:    The following studies were reviewed today:   EKG:  EKG Interpretation Date/Time:  Monday February 08 2024 10:06:54 EDT Ventricular Rate:  79 PR Interval:  162 QRS Duration:  90 QT Interval:  380 QTC Calculation: 435 R Axis:   62  Text Interpretation: Normal sinus rhythm Normal ECG No previous ECGs available Confirmed by Ralene Burger [phone]) on 02/08/2024 10:09:34 AM    Recent Labs: No results found for requested labs within last 365 days.  Recent Lipid Panel No results found for: "CHOL", "TRIG", "HDL", "CHOLHDL", "VLDL", "LDLCALC", "LDLDIRECT"  Physical Exam:    VS:  BP 122/86 (BP Location: Right Arm, Patient Position: Sitting)   Pulse 79   Ht 5\' 9"  (1.753 m)   Wt 204 lb 12.8 oz (92.9 kg)   SpO2 98%   BMI 30.24 kg/m     Wt Readings from Last 3 Encounters:  02/08/24 204 lb 12.8 oz (92.9 kg)  09/14/20 189 lb (85.7 kg)   03/11/17 180 lb 14.4 oz (82.1 kg)     GEN:  Well nourished, well developed in no acute distress HEENT: Normal NECK: No JVD; No carotid bruits LYMPHATICS: No lymphadenopathy CARDIAC: RRR, no murmurs, no rubs, no gallops RESPIRATORY:  Clear to auscultation without rales, wheezing or rhonchi  ABDOMEN: Soft, non-tender, non-distended MUSCULOSKELETAL:  No edema; No deformity  SKIN: Warm and dry NEUROLOGIC:  Alert and oriented x 3 PSYCHIATRIC:  Normal affect   ASSESSMENT:    1. Cardiac arrhythmia, unspecified cardiac arrhythmia type   2. PVC's (premature  ventricular contractions)   3. Dyslipidemia   4. Dyspnea on exertion   5. History of smoking    PLAN:    In order of problems listed above:  Cardiac arrhythmia, PVCs noted to have by primary care physician on the EKG.  He does have trigeminy today's EKG did not show that.  Will put Zio patch for 2 weeks to see what kind of arrhythmia with dealing with. As a part of evaluation will schedule him to have echocardiogram to assess left ventricle ejection fraction.  This is to stratify his arrhythmia noted on the EKG. In terms of assessment of his coronary arteries.  I will schedule him to have a calcium score that help me to determine how aggressive we need to be with management of his cholesterol. Dyslipidemia he Low good cholesterol which was 24 in March of this year, LDL 52 he is taking statin in form of Lipitor 40 mg which we will continue. History of smoking, I encouraged him to stay away from smoking he understand this and he is very proud of himself and he should be that he quit smoking   Medication Adjustments/Labs and Tests Ordered: Current medicines are reviewed at length with the patient today.  Concerns regarding medicines are outlined above.  Orders Placed This Encounter  Procedures   EKG 12-Lead   No orders of the defined types were placed in this encounter.   Signed, Manfred Seed, MD, [facility]. 02/08/2024 10:35 AM     [facility]

## 2024-02-08 NOTE — Patient Instructions (Signed)
 Medication Instructions:  Your physician recommends that you continue on your current medications as directed. Please refer to the Current Medication list given to you today.  *If you need a refill on your cardiac medications before your next appointment, please call your pharmacy*   Lab Work: None Ordered If you have labs (blood work) drawn today and your tests are completely normal, you will receive your results only by: MyChart Message (if you have MyChart) OR A paper copy in the mail If you have any lab test that is abnormal or we need to change your treatment, we will call you to review the results.   Testing/Procedures: Your physician has requested that you have an echocardiogram. Echocardiography is a painless test that uses sound waves to create images of your heart. It provides your doctor with information about the size and shape of your heart and how well your heart's chambers and valves are working. This procedure takes approximately one hour. There are no restrictions for this procedure. Please do NOT wear cologne, perfume, aftershave, or lotions (deodorant is allowed). Please arrive 15 minutes prior to your appointment time.  Please note: We ask at that you not bring children with you during ultrasound (echo/ vascular) testing. Due to room size and safety concerns, children are not allowed in the ultrasound rooms during exams. Our front office staff cannot provide observation of children in our lobby area while testing is being conducted. An adult accompanying a patient to their appointment will only be allowed in the ultrasound room at the discretion of the ultrasound technician under special circumstances. We apologize for any inconvenience.    WHY IS MY DOCTOR PRESCRIBING ZIO? The Zio system is proven and trusted by physicians to detect and diagnose irregular heart rhythms -- and has been prescribed to hundreds of thousands of patients.  The FDA has cleared the Zio system to  monitor for many different kinds of irregular heart rhythms. In a study, physicians were able to reach a diagnosis 90% of the time with the Zio system1.  You can wear the Zio monitor -- a small, discreet, comfortable patch -- during your normal day-to-day activity, including while you sleep, shower, and exercise, while it records every single heartbeat for analysis.  1Barrett, P., et al. Comparison of 24 Hour Holter Monitoring Versus 14 Day Novel Adhesive Patch Electrocardiographic Monitoring. American Journal of Medicine, 2014.  ZIO VS. HOLTER MONITORING The Zio monitor can be comfortably worn for up to 14 days. Holter monitors can be worn for 24 to 48 hours, limiting the time to record any irregular heart rhythms you may have. Zio is able to capture data for the 51% of patients who have their first symptom-triggered arrhythmia after 48 hours.1  LIVE WITHOUT RESTRICTIONS The Zio ambulatory cardiac monitor is a small, unobtrusive, and water-resistant patch--you might even forget you're wearing it. The Zio monitor records and stores every beat of your heart, whether you're sleeping, working out, or showering.      We will order CT coronary calcium score.  It will cost $99.00 and is not covered by insurance.  Please call to schedule.    [facility] [address] [place], [place] [address] [phone]   Follow-Up: At [facility], you and your health needs are our priority.  As part of our continuing mission to provide you with exceptional heart care, we have created designated Provider Care Teams.  These Care Teams include your primary Cardiologist (physician) and Advanced Practice Providers (APPs -  Physician  Assistants and Nurse Practitioners) who all work together to provide you with the care you need, when you need it.  We recommend signing up for the patient portal called "MyChart".  Sign up information is provided on this After Visit Summary.  MyChart is used to connect with  patients for Virtual Visits (Telemedicine).  Patients are able to view lab/test results, encounter notes, upcoming appointments, etc.  Non-urgent messages can be sent to your provider as well.   To learn more about what you can do with MyChart, go to [email or web address].    Your next appointment:   2 month(s)  The format for your next appointment:   In Person  Provider:   Ralene Burger, MD    Other Instructions NA

## 2024-02-16 ENCOUNTER — Other Ambulatory Visit (HOSPITAL_BASED_OUTPATIENT_CLINIC_OR_DEPARTMENT_OTHER): Admitting: Radiology

## 2024-02-26 ENCOUNTER — Ambulatory Visit: Attending: Cardiology

## 2024-02-26 DIAGNOSIS — R0609 Other forms of dyspnea: Secondary | ICD-10-CM | POA: Diagnosis not present

## 2024-02-27 LAB — ECHOCARDIOGRAM COMPLETE
Area-P 1/2: 3.72 cm2
S' Lateral: 2.5 cm

## 2024-03-01 ENCOUNTER — Ambulatory Visit: Payer: Self-pay | Admitting: Cardiology

## 2024-03-14 DIAGNOSIS — I493 Ventricular premature depolarization: Secondary | ICD-10-CM | POA: Diagnosis not present

## 2024-03-31 ENCOUNTER — Telehealth: Payer: Self-pay | Admitting: Cardiology

## 2024-03-31 MED ORDER — METOPROLOL SUCCINATE ER 25 MG PO TB24: 25.0000 mg | ORAL_TABLET | Freq: Every day | ORAL | 1 refills | Status: DC

## 2024-03-31 NOTE — Telephone Encounter (Signed)
Daughter is returning call. Please advise

## 2024-03-31 NOTE — Telephone Encounter (Signed)
 Spoke with the patient daughter. See previous encounter.

## 2024-03-31 NOTE — Telephone Encounter (Signed)
 Spoke with Albert Hubbard, Patient notified of results and recommendations and agreed with plan, medication sent.

## 2024-03-31 NOTE — Telephone Encounter (Signed)
-----   Message from Ralene Burger sent at 03/25/2024  9:01 AM EDT ----- Monitor show 1 episode of ventricular tachycardia, please start metoprolol succinate 25 mg daily ----- Message ----- From: Krasowski, Robert J, MD Sent: 03/14/2024   9:10 AM EDT To: Manfred Seed, MD

## 2024-04-07 ENCOUNTER — Telehealth: Payer: Self-pay

## 2024-04-07 NOTE — Telephone Encounter (Signed)
 Left vm to return call  Monitor show 1 episode of ventricular tachycardia, please start metoprolol  succinate 25 mg daily

## 2024-04-11 ENCOUNTER — Other Ambulatory Visit: Payer: Self-pay

## 2024-04-11 NOTE — Telephone Encounter (Signed)
 Left message for the patient to call back.

## 2024-04-11 NOTE — Telephone Encounter (Signed)
 Called the patient's daughter Pedro Whiters per the [facility] and informed her that the patient had one episode of ventricular tachycardia. Toprol  25 mg daily was ordered for the patient. The patient's next appointment on 04/14/24 at 1:00 pm was verified with Harlene and she had no further questions at this time.

## 2024-04-14 ENCOUNTER — Ambulatory Visit: Admitting: Cardiology

## 2024-04-18 ENCOUNTER — Other Ambulatory Visit: Payer: Self-pay

## 2024-04-18 ENCOUNTER — Telehealth: Payer: Self-pay | Admitting: Cardiology

## 2024-04-18 MED ORDER — METOPROLOL SUCCINATE ER 25 MG PO TB24: 25.0000 mg | ORAL_TABLET | Freq: Every day | ORAL | 1 refills | Status: DC

## 2024-04-18 NOTE — Telephone Encounter (Signed)
 Patient's metoprolol  that was just ordered needs to be sent to [facility] in [place] not [address]  [place] number [phone]

## 2024-04-18 NOTE — Telephone Encounter (Signed)
 Pt requested Metoprolol  Succinate 25 be sent to [facility] [place] instead of [facility] [place]

## 2024-05-18 ENCOUNTER — Ambulatory Visit: Attending: Cardiology | Admitting: Cardiology

## 2024-05-18 ENCOUNTER — Encounter: Payer: Self-pay | Admitting: Cardiology

## 2024-05-18 VITALS — BP 114/66 | HR 68 | Ht 69.0 in | Wt 208.0 lb

## 2024-05-18 DIAGNOSIS — E785 Hyperlipidemia, unspecified: Secondary | ICD-10-CM

## 2024-05-18 DIAGNOSIS — I493 Ventricular premature depolarization: Secondary | ICD-10-CM

## 2024-05-18 DIAGNOSIS — Z87891 Personal history of nicotine dependence: Secondary | ICD-10-CM | POA: Diagnosis not present

## 2024-05-18 NOTE — Progress Notes (Unsigned)
 Cardiology Office Note:    Date:  05/18/2024   ID:  Albert Hubbard, DOB 1961/05/21, MRN [id number]  PCP:  Silvano Angeline FALCON, NP  Cardiologist:  Lamar Fitch, MD    Referring MD: Silvano Angeline FALCON, NP   No chief complaint on file.   History of Present Illness:    Albert Hubbard is a 63 y.o. male past medical history significant for chronic back problem, depression, dyslipidemia, restless leg syndrome he was referred to us  because his EKG shows frequent PVCs, he did have echocardiogram done which showed preserved ejection fraction, monitor which showed surprisingly run of nonsustained ventricular tachycardia which was asymptomatic.  He is here to talk about it.  Overall he is doing well he does what he wants to do with no difficulties.  Past Medical History:  Diagnosis Date   Anxiety    Arthritis    Chronic back pain    Depression    takes Sertaline daily   Dyspnea    rarely but when does it's with exertion   Hard of hearing    Headache    Peripheral neuropathy    takes Gabapentin  daily   Restless leg syndrome    takes Requip  daily    Past Surgical History:  Procedure Laterality Date   ANTERIOR CERVICAL DECOMP/DISCECTOMY FUSION N/A 03/11/2017   Procedure: Stage 1: C4-7 Anterior cervical discectomy with fusion and plate fixation;  Surgeon: Ditty, Morene Hicks, MD;  Location: [facility] OR;  Service: Neurosurgery;  Laterality: N/A;  Stage 1: C4-7 Anterior cervical discectomy with fusion and plate fixation   HERNIA REPAIR     INNER EAR SURGERY     MOUTH SURGERY     POSTERIOR CERVICAL FUSION/FORAMINOTOMY N/A 03/12/2017   Procedure: Stage 2: Cervical three-Thoracic one Laminectiomies with foraminotomies, fixation and fusion;  Surgeon: Ditty, Morene Hicks, MD;  Location: [facility] OR;  Service: Neurosurgery;  Laterality: N/A;  Stage 2: C3-T1 Laminectiomies with foraminotomies, fixation and fusion   TONSILLECTOMY      Current Medications: Current Meds  Medication Sig   albuterol (VENTOLIN  HFA) 108 (90 Base) MCG/ACT inhaler Inhale 2 puffs into the lungs every 6 (six) hours as needed for wheezing or shortness of breath.   atorvastatin (LIPITOR) 40 MG tablet Take 40 mg by mouth at bedtime.   escitalopram (LEXAPRO) 10 MG tablet Take 10 mg by mouth daily.   metoprolol  succinate (TOPROL  XL) 25 MG 24 hr tablet Take 1 tablet (25 mg total) by mouth daily.   rOPINIRole  (REQUIP ) 0.25 MG tablet Take 0.25 mg by mouth daily as needed (restless).     Allergies:   Cortisone   Social History   Socioeconomic History   Marital status: Legally Separated    Spouse name: Not on file   Number of children: Not on file   Years of education: Not on file   Highest education level: Not on file  Occupational History   Not on file  Tobacco Use   Smoking status: Former   Smokeless tobacco: Never   Tobacco comments:    stopped smoking over a yr ago  Vaping Use   Vaping status: Never Used  Substance and Sexual Activity   Alcohol use: Yes    Comment: 6 pks a week   Drug use: No   Sexual activity: Not on file  Other Topics Concern   Not on file  Social History Narrative   Not on file   Social Drivers of Health   Financial Resource Strain: Not on file  Food Insecurity: Not on file  Transportation Needs: Not on file  Physical Activity: Not on file  Stress: Not on file  Social Connections: Not on file     Family History: The patient's family history is not on file. ROS:   Please see the history of present illness.    All 14 point review of systems negative except as described per history of present illness  EKGs/Labs/Other Studies Reviewed:         Recent Labs: No results found for requested labs within last 365 days.  Recent Lipid Panel No results found for: CHOL, TRIG, HDL, CHOLHDL, VLDL, LDLCALC, LDLDIRECT  Physical Exam:    VS:  BP 114/66   Pulse 68   Ht 5' 9 (1.753 m)   Wt 208 lb (94.3 kg)   SpO2 96%   BMI 30.72 kg/m     Wt Readings from Last 3  Encounters:  05/18/24 208 lb (94.3 kg)  02/08/24 204 lb 12.8 oz (92.9 kg)  09/14/20 189 lb (85.7 kg)     GEN:  Well nourished, well developed in no acute distress HEENT: Normal NECK: No JVD; No carotid bruits LYMPHATICS: No lymphadenopathy CARDIAC: RRR, no murmurs, no rubs, no gallops RESPIRATORY:  Clear to auscultation without rales, wheezing or rhonchi  ABDOMEN: Soft, non-tender, non-distended MUSCULOSKELETAL:  No edema; No deformity  SKIN: Warm and dry LOWER EXTREMITIES: no swelling NEUROLOGIC:  Alert and oriented x 3 PSYCHIATRIC:  Normal affect   ASSESSMENT:    1. PVC's (premature ventricular contractions)   2. Dyslipidemia   3. History of smoking    PLAN:    In order of problems listed above:  Nonsustained ventricular tachycardia which is new diagnosis.  Short run asymptomatic, echocardiogram preserved ejection fraction, will do plain EKG treadmill stress test to see if he get any evidence of ischemia and also make sure she does not have any exercise-induced ventricular tachycardia. Dyslipidemia I wanted him to have a calcium score he cannot afford it, he is taking Lipitor 40 which I continue for now. History of smoking abstain from smoking.  Stay away from it. PVCs we initiated beta-blocker.   Medication Adjustments/Labs and Tests Ordered: Current medicines are reviewed at length with the patient today.  Concerns regarding medicines are outlined above.  No orders of the defined types were placed in this encounter.  Medication changes: No orders of the defined types were placed in this encounter.   Signed, Lamar DOROTHA Fitch, MD, [facility] 05/18/2024 3:55 PM    [facility]

## 2024-05-18 NOTE — Patient Instructions (Signed)
 Medication Instructions:  Your physician recommends that you continue on your current medications as directed. Please refer to the Current Medication list given to you today.  *If you need a refill on your cardiac medications before your next appointment, please call your pharmacy*   Lab Work: None Ordered If you have labs (blood work) drawn today and your tests are completely normal, you will receive your results only by: MyChart Message (if you have MyChart) OR A paper copy in the mail If you have any lab test that is abnormal or we need to change your treatment, we will call you to review the results.   Testing/Procedures: Exercise Stress Test  Dress to Exercise  Eat a light Breakfast   Follow-Up: At [facility], you and your health needs are our priority.  As part of our continuing mission to provide you with exceptional heart care, we have created designated Provider Care Teams.  These Care Teams include your primary Cardiologist (physician) and Advanced Practice Providers (APPs -  Physician Assistants and Nurse Practitioners) who all work together to provide you with the care you need, when you need it.  We recommend signing up for the patient portal called MyChart.  Sign up information is provided on this After Visit Summary.  MyChart is used to connect with patients for Virtual Visits (Telemedicine).  Patients are able to view lab/test results, encounter notes, upcoming appointments, etc.  Non-urgent messages can be sent to your provider as well.   To learn more about what you can do with MyChart, go to [email or web address].    Your next appointment:   5 month(s)  The format for your next appointment:   In Person  Provider:   Lamar Fitch, MD    Other Instructions NA

## 2024-06-08 ENCOUNTER — Encounter: Payer: Self-pay | Admitting: *Deleted

## 2024-06-20 ENCOUNTER — Telehealth: Payer: Self-pay | Admitting: Cardiology

## 2024-06-20 NOTE — Telephone Encounter (Signed)
 Patient wants to reschedule his CUP EXERCISE TOLERANCE TEST in [place] and will need his orders extended.  Patient noted can also speak with his daughter Harlene at [phone].

## 2024-06-21 ENCOUNTER — Ambulatory Visit

## 2024-06-23 ENCOUNTER — Telehealth (HOSPITAL_COMMUNITY): Payer: Self-pay | Admitting: Cardiology

## 2024-06-23 NOTE — Telephone Encounter (Signed)
 Patient cancelled GXT for reason below:  06/20/2024 1:10 PM Ab:TPODNW, Albert Hubbard  Cancel Rsn: Patient (Appointment conflict)   06/23/24  LMCB@ 3:04 LBW  Order will be removed from the active WQ and it patient calls back we will reinstate the order. Thank you.

## 2024-08-01 ENCOUNTER — Telehealth: Payer: Self-pay | Admitting: Cardiology

## 2024-08-01 MED ORDER — METOPROLOL SUCCINATE ER 25 MG PO TB24: 25.0000 mg | ORAL_TABLET | Freq: Every day | ORAL | 2 refills | Status: AC

## 2024-08-01 NOTE — Telephone Encounter (Signed)
 Patient is requesting a refill on his metoprolol  25mg . CB # [phone]

## 2024-08-01 NOTE — Telephone Encounter (Signed)
 Pt's medication was sent to pt's pharmacy as requested. Confirmation received.
